# Patient Record
Sex: Female | Born: 1965 | Race: White | Hispanic: No | Marital: Married | Smoking: Never smoker
Health system: Southern US, Community
[De-identification: ages and names within clinical notes are randomized; demographics above are authoritative.]

## PROBLEM LIST (undated history)

## (undated) DIAGNOSIS — F329 Major depressive disorder, single episode, unspecified: Secondary | ICD-10-CM

## (undated) DIAGNOSIS — F32A Depression, unspecified: Secondary | ICD-10-CM

---

## 2008-11-08 ENCOUNTER — Ambulatory Visit: Payer: Self-pay

## 2011-08-13 ENCOUNTER — Emergency Department: Payer: Self-pay | Admitting: Emergency Medicine

## 2011-08-14 LAB — COMPREHENSIVE METABOLIC PANEL
Alkaline Phosphatase: 58 U/L (ref 50–136)
Anion Gap: 16 (ref 7–16)
BUN: 15 mg/dL (ref 7–18)
Bilirubin,Total: 0.2 mg/dL (ref 0.2–1.0)
Calcium, Total: 8.6 mg/dL (ref 8.5–10.1)
Chloride: 99 mmol/L (ref 98–107)
Co2: 28 mmol/L (ref 21–32)
Creatinine: 0.88 mg/dL (ref 0.60–1.30)
EGFR (African American): >60
EGFR (Non-African Amer.): >60
Glucose: 111 mg/dL — ABNORMAL HIGH (ref 65–99)
Potassium: 3.3 mmol/L — ABNORMAL LOW (ref 3.5–5.1)
SGOT(AST): 26 U/L (ref 15–37)
SGPT (ALT): 24 U/L

## 2011-08-14 LAB — CBC
HCT: 31.5 % — ABNORMAL LOW (ref 35.0–47.0)
HGB: 10.1 g/dL — ABNORMAL LOW (ref 12.0–16.0)
MCH: 24.3 pg — ABNORMAL LOW (ref 26.0–34.0)
MCHC: 31.9 g/dL — ABNORMAL LOW (ref 32.0–36.0)
Platelet: 350 10*3/uL (ref 150–440)
RBC: 4.15 10*6/uL (ref 3.80–5.20)
WBC: 8.4 10*3/uL (ref 3.6–11.0)

## 2011-08-14 LAB — TROPONIN I: Troponin-I: <0.02 ng/mL

## 2012-02-26 ENCOUNTER — Ambulatory Visit: Payer: Self-pay | Admitting: Obstetrics and Gynecology

## 2012-03-01 LAB — PATHOLOGY REPORT

## 2013-02-10 ENCOUNTER — Other Ambulatory Visit: Payer: Self-pay | Admitting: Urgent Care

## 2013-02-10 LAB — HEPATIC FUNCTION PANEL A (ARMC)
Alkaline Phosphatase: 69 U/L (ref 50–136)
Bilirubin, Direct: 0.1 mg/dL (ref 0.00–0.20)
Bilirubin,Total: 0.4 mg/dL (ref 0.2–1.0)
SGOT(AST): 21 U/L (ref 15–37)

## 2013-02-11 LAB — FERRITIN: Ferritin (ARMC): 3 ng/mL — ABNORMAL LOW (ref 8–388)

## 2013-02-17 ENCOUNTER — Ambulatory Visit: Payer: Self-pay | Admitting: Gastroenterology

## 2013-03-02 ENCOUNTER — Ambulatory Visit: Payer: Self-pay | Admitting: Urgent Care

## 2013-03-10 ENCOUNTER — Emergency Department: Payer: Self-pay | Admitting: Emergency Medicine

## 2013-03-10 LAB — CBC
HCT: 29.5 % — ABNORMAL LOW (ref 35.0–47.0)
MCV: 73 fL — ABNORMAL LOW (ref 80–100)
Platelet: 343 10*3/uL (ref 150–440)
RBC: 4.06 10*6/uL (ref 3.80–5.20)
RDW: 15.9 % — ABNORMAL HIGH (ref 11.5–14.5)
WBC: 9.1 10*3/uL (ref 3.6–11.0)

## 2013-03-10 LAB — TSH: Thyroid Stimulating Horm: 1.59 u[IU]/mL

## 2013-03-10 LAB — BASIC METABOLIC PANEL
Anion Gap: 6 — ABNORMAL LOW (ref 7–16)
Calcium, Total: 8.9 mg/dL (ref 8.5–10.1)
Chloride: 104 mmol/L (ref 98–107)
Co2: 27 mmol/L (ref 21–32)
EGFR (Non-African Amer.): >60
Glucose: 99 mg/dL (ref 65–99)
Osmolality: 274 (ref 275–301)
Potassium: 3.6 mmol/L (ref 3.5–5.1)
Sodium: 137 mmol/L (ref 136–145)

## 2013-03-10 LAB — TROPONIN I: Troponin-I: <0.02 ng/mL

## 2014-11-14 NOTE — Op Note (Signed)
PATIENT NAME:  Adrienne Todd, Adrienne Todd MR#:  [id number] DATE OF BIRTH:  May 20, 1966  DATE OF PROCEDURE:  02/26/2012  PREOPERATIVE DIAGNOSES:  1. Menorrhagia.  2. Endometrial polyp.   POSTOPERATIVE DIAGNOSES:  1. Menorrhagia.  2. Endometrial polyp.   PROCEDURES PERFORMED: Hysteroscopy and dilatation and curettage using MyoSure.   PRIMARY SURGEON: Florina Oundreas M. Bonney AidStaebler, MD  ANESTHESIA: General.   ESTIMATED BLOOD LOSS: Minimal.   OPERATIVE FLUIDS: 800 mL of crystalloid.   URINE OUTPUT: 30 mL straight catheter prior to beginning of the case.  FLUID DEFICIT: 50 mL with normal saline.   COMPLICATIONS: None.   INTRAOPERATIVE FINDINGS: Normal cervix and cervical canal. Bilateral tubal ostia were visualized. A small 1 cm broad-based polyp was noted on the anterior wall of the uterus and was removed using the MyoSure system. Further curettage of the endometrial cavity was taken with the MyoSure yielding minimal to moderate amount of tissue.   DRAINS OR TUBES: None.   IMPLANTS: None.   PREOPERATIVE ANTIBIOTICS: None.  SPECIMEN: Endometrial curetting.   POSTOPERATIVE CONDITION: Stable.   PROCEDURE IN DETAIL: Risks, benefits, and alternatives of the procedure were discussed with the patient prior to proceeding to the Operating Room. The patient was taken back to the Operating Room and placed under general endotracheal anesthesia, positioned in the dorsal lithotomy position. She was prepped and draped in the usual sterile fashion. Prior to beginning of the case, time out was performed after which the patient's bladder was straight-catheterized. Following the straight cath, a sterile speculum was placed, the cervix was visualized, and the anterior lip grasped with a single tooth tenaculum. The cervix was sequentially dilated using Pratt dilators to allow placement of the MyoSure scope. Following dilation hysteroscopy was begun noting the above findings. The polyp was removed in its entirety using the  MyoSure system. Following this further curettage was performed with the MyoSure to further sample the endometrial lining. Following this the cavity appeared grossly normal. The polyp had been removed without any stalk left behind. On retraction of the hysteroscope, the cervical canal was once again inspected and noted to be hemostatic. The tenaculum was removed and tenaculum sites were noted to be hemostatic. Afterwards the sterile speculum was noted. Sponge, needle, and instrument counts were correct x2. The patient tolerated the procedure well and was taken to Recovery Room in stable condition.  ____________________________ Florina OuAndreas M. Bonney AidStaebler, MD ams:slb D: 02/26/2012 22:33:50 ET T: 02/27/2012 11:51:42 ET JOB#: [id number]  cc: Florina OuAndreas M. Bonney AidStaebler, MD, <Dictator> Carmel SacramentoANDREAS Cathrine MusterM Marshea Wisher MD ELECTRONICALLY SIGNED 03/13/2012 2:12

## 2014-11-20 ENCOUNTER — Emergency Department
Admit: 2014-11-20 | Disposition: A | Discharge status: Home / Self Care | Payer: Self-pay | Admitting: Emergency Medicine

## 2015-03-20 ENCOUNTER — Encounter: Payer: Self-pay | Admitting: Emergency Medicine

## 2015-03-20 ENCOUNTER — Emergency Department: Payer: Commercial Managed Care - HMO

## 2015-03-20 ENCOUNTER — Inpatient Hospital Stay
Admit: 2015-03-20 | Discharge: 2015-03-20 | Disposition: A | Discharge status: Home / Self Care | Payer: Commercial Managed Care - HMO | Attending: Internal Medicine | Admitting: Internal Medicine

## 2015-03-20 ENCOUNTER — Observation Stay
Admission: EM | Admit: 2015-03-20 | Discharge: 2015-03-21 | Disposition: A | Discharge status: Home / Self Care | Payer: Commercial Managed Care - HMO | Attending: Internal Medicine | Admitting: Internal Medicine

## 2015-03-20 ENCOUNTER — Inpatient Hospital Stay: Payer: Commercial Managed Care - HMO

## 2015-03-20 DIAGNOSIS — K219 Gastro-esophageal reflux disease without esophagitis: Secondary | ICD-10-CM | POA: Insufficient documentation

## 2015-03-20 DIAGNOSIS — Z823 Family history of stroke: Secondary | ICD-10-CM | POA: Diagnosis not present

## 2015-03-20 DIAGNOSIS — Z8249 Family history of ischemic heart disease and other diseases of the circulatory system: Secondary | ICD-10-CM | POA: Diagnosis not present

## 2015-03-20 DIAGNOSIS — I6523 Occlusion and stenosis of bilateral carotid arteries: Secondary | ICD-10-CM | POA: Insufficient documentation

## 2015-03-20 DIAGNOSIS — R200 Anesthesia of skin: Secondary | ICD-10-CM | POA: Insufficient documentation

## 2015-03-20 DIAGNOSIS — H538 Other visual disturbances: Secondary | ICD-10-CM | POA: Diagnosis not present

## 2015-03-20 DIAGNOSIS — Z880 Allergy status to penicillin: Secondary | ICD-10-CM | POA: Diagnosis not present

## 2015-03-20 DIAGNOSIS — I6502 Occlusion and stenosis of left vertebral artery: Secondary | ICD-10-CM | POA: Diagnosis not present

## 2015-03-20 DIAGNOSIS — J341 Cyst and mucocele of nose and nasal sinus: Secondary | ICD-10-CM | POA: Diagnosis not present

## 2015-03-20 DIAGNOSIS — R51 Headache: Secondary | ICD-10-CM | POA: Diagnosis not present

## 2015-03-20 DIAGNOSIS — I639 Cerebral infarction, unspecified: Secondary | ICD-10-CM | POA: Diagnosis present

## 2015-03-20 DIAGNOSIS — F329 Major depressive disorder, single episode, unspecified: Secondary | ICD-10-CM | POA: Insufficient documentation

## 2015-03-20 DIAGNOSIS — R531 Weakness: Secondary | ICD-10-CM | POA: Insufficient documentation

## 2015-03-20 DIAGNOSIS — Z79899 Other long term (current) drug therapy: Secondary | ICD-10-CM | POA: Insufficient documentation

## 2015-03-20 DIAGNOSIS — M542 Cervicalgia: Secondary | ICD-10-CM

## 2015-03-20 HISTORY — DX: Depression, unspecified: F32.A

## 2015-03-20 HISTORY — DX: Major depressive disorder, single episode, unspecified: F32.9

## 2015-03-20 LAB — COMPREHENSIVE METABOLIC PANEL
ALK PHOS: 66 U/L (ref 38–126)
ALT: 20 U/L (ref 14–54)
AST: 24 U/L (ref 15–41)
Albumin: 4.9 g/dL (ref 3.5–5.0)
Anion gap: 11 (ref 5–15)
BUN: 12 mg/dL (ref 6–20)
CALCIUM: 9.5 mg/dL (ref 8.9–10.3)
CO2: 27 mmol/L (ref 22–32)
CREATININE: 1 mg/dL (ref 0.44–1.00)
Chloride: 96 mmol/L — ABNORMAL LOW (ref 101–111)
Glucose, Bld: 83 mg/dL (ref 65–99)
Potassium: 3.7 mmol/L (ref 3.5–5.1)
Sodium: 134 mmol/L — ABNORMAL LOW (ref 135–145)
Total Bilirubin: 0.8 mg/dL (ref 0.3–1.2)
Total Protein: 9 g/dL — ABNORMAL HIGH (ref 6.5–8.1)

## 2015-03-20 LAB — CBC WITH DIFFERENTIAL/PLATELET
Basophils Absolute: 0 10*3/uL (ref 0–0.1)
Basophils Relative: 1 %
EOS PCT: 1 %
Eosinophils Absolute: 0.1 10*3/uL (ref 0–0.7)
HCT: 42 % (ref 35.0–47.0)
HEMOGLOBIN: 13.4 g/dL (ref 12.0–16.0)
LYMPHS ABS: 1.8 10*3/uL (ref 1.0–3.6)
Lymphocytes Relative: 23 %
MCH: 25.6 pg — AB (ref 26.0–34.0)
MCHC: 31.9 g/dL — ABNORMAL LOW (ref 32.0–36.0)
MCV: 80.3 fL (ref 80.0–100.0)
Monocytes Absolute: 0.3 10*3/uL (ref 0.2–0.9)
Monocytes Relative: 4 %
Neutro Abs: 5.8 10*3/uL (ref 1.4–6.5)
Neutrophils Relative %: 71 %
Platelets: 320 10*3/uL (ref 150–440)
RBC: 5.24 MIL/uL — AB (ref 3.80–5.20)
RDW: 16.3 % — ABNORMAL HIGH (ref 11.5–14.5)
WBC: 8.1 10*3/uL (ref 3.6–11.0)

## 2015-03-20 LAB — TROPONIN I
Troponin I: <0.03 ng/mL (ref <0.031)
Troponin I: <0.03 ng/mL (ref <0.031)

## 2015-03-20 LAB — LIPID PANEL
CHOLESTEROL: 264 mg/dL — AB (ref 0–200)
HDL: 58 mg/dL (ref >40)
LDL Cholesterol: 193 mg/dL — ABNORMAL HIGH (ref 0–99)
TRIGLYCERIDES: 65 mg/dL (ref <150)
Total CHOL/HDL Ratio: 4.6 RATIO
VLDL: 13 mg/dL (ref 0–40)

## 2015-03-20 LAB — PROTIME-INR
INR: 0.95
PROTHROMBIN TIME: 12.9 s (ref 11.4–15.0)

## 2015-03-20 MED ORDER — DOCUSATE SODIUM 100 MG PO CAPS: 100.0000 mg | ORAL_CAPSULE | Freq: Two times a day (BID) | ORAL | Status: DC | PRN

## 2015-03-20 MED ORDER — ASPIRIN 81 MG PO CHEW
81.0000 mg | CHEWABLE_TABLET | Freq: Once | ORAL | Status: AC
Start: 2015-03-20 — End: 2015-03-20
  Administered 2015-03-20: 81 mg via ORAL
  Filled 2015-03-20: qty 1

## 2015-03-20 MED ORDER — LORAZEPAM 2 MG/ML IJ SOLN
2.0000 mg | Freq: Once | INTRAMUSCULAR | Status: AC
  Administered 2015-03-21: 09:00:00 2 mg via INTRAVENOUS
  Filled 2015-03-20: qty 1

## 2015-03-20 MED ORDER — ENOXAPARIN SODIUM 40 MG/0.4ML ~~LOC~~ SOLN
40.0000 mg | SUBCUTANEOUS | Status: DC
  Administered 2015-03-20: 40 mg via SUBCUTANEOUS
  Filled 2015-03-20: qty 0.4

## 2015-03-20 MED ORDER — SENNA 8.6 MG PO TABS: 1.0000 | ORAL_TABLET | Freq: Every day | ORAL | Status: DC | PRN

## 2015-03-20 MED ORDER — ACETAMINOPHEN 325 MG PO TABS
650.0000 mg | ORAL_TABLET | Freq: Four times a day (QID) | ORAL | Status: DC | PRN
  Administered 2015-03-20: 20:00:00 650 mg via ORAL
  Filled 2015-03-20: qty 2

## 2015-03-20 MED ORDER — PANTOPRAZOLE SODIUM 40 MG PO TBEC
40.0000 mg | DELAYED_RELEASE_TABLET | Freq: Every day | ORAL | Status: DC
  Administered 2015-03-20 – 2015-03-21 (×2): 40 mg via ORAL
  Filled 2015-03-20 (×2): qty 1

## 2015-03-20 MED ORDER — LORAZEPAM 2 MG/ML IJ SOLN
INTRAMUSCULAR | Status: AC
  Filled 2015-03-20: qty 1

## 2015-03-20 MED ORDER — SODIUM CHLORIDE 0.9 % IJ SOLN
3.0000 mL | Freq: Two times a day (BID) | INTRAMUSCULAR | Status: DC
  Administered 2015-03-20 – 2015-03-21 (×2): 3 mL via INTRAVENOUS

## 2015-03-20 MED ORDER — SERTRALINE HCL 50 MG PO TABS
50.0000 mg | ORAL_TABLET | Freq: Every day | ORAL | Status: DC
  Administered 2015-03-21: 11:00:00 50 mg via ORAL
  Filled 2015-03-20: qty 1

## 2015-03-20 MED ORDER — ACETAMINOPHEN 650 MG RE SUPP: 650.0000 mg | Freq: Four times a day (QID) | RECTAL | Status: DC | PRN

## 2015-03-20 MED ORDER — ALBUTEROL SULFATE (2.5 MG/3ML) 0.083% IN NEBU: 2.5000 mg | INHALATION_SOLUTION | RESPIRATORY_TRACT | Status: DC | PRN

## 2015-03-20 MED ORDER — ASPIRIN EC 81 MG PO TBEC
81.0000 mg | DELAYED_RELEASE_TABLET | Freq: Every day | ORAL | Status: DC
  Administered 2015-03-21: 11:00:00 81 mg via ORAL
  Filled 2015-03-20: qty 1

## 2015-03-20 NOTE — Plan of Care (Signed)
Problem: Discharge/Transitional Outcomes Goal: Barriers To Progression Addressed/Resolved Outcome: Progressing Pt lives at home with husband and works full time. She complains of numbness and heaviness to left arm and leg. NIH was a 0 x2.Left sided weakness reported and grip is weaker then right. Pt saw a chiropractic 1 week ago for a constant headache. Woke up 03/19/15 with dizziness and came to Er for further evaluation. Asprin given in the ER and numerology consulted

## 2015-03-20 NOTE — Discharge Summary (Deleted)
[facility] - Short at [facility]   PATIENT NAME: Adrienne Todd    MR#:  [id number]  DATE OF BIRTH:  [birth year]  DATE OF ADMISSION:  03/20/2015 ADMITTING PHYSICIAN: Enid Baas, MD  DATE OF DISCHARGE: Patient left AMA on 03/20/2015  PRIMARY CARE PHYSICIAN: Marta Antu, CNM    ADMISSION DIAGNOSIS:  Numbness; Tingling  DISCHARGE DIAGNOSIS:  Active Problems:   CVA (cerebral infarction) or TIA  SECONDARY DIAGNOSIS:   Past Medical History  Diagnosis Date  . Depression     HOSPITAL COURSE:   Adrienne Todd is a 49 y.o. female with no significant past medical history presents to the hospital secondary to a sided tingling and numbness that started early this morning.   #1 TIA versus CVA-left-sided tingling and numbness and heaviness of the arm. Also some leg symptoms noted. Symptoms are much improved than this morning. So could be a TIA. -Admitted, do neuro checks. -MRI and MRA of the brain, carotid Dopplers and echocardiogram -PT and OT consults. -HbA1c and lipid profile for risk stratification -Started on aspirin at this time. Patient couldn't tolerate MRA/MRI- she was asked prior to sending to MRI about the option of sedation and she refused at that time. Halfway through MRA- she couldn't tolerate it. She felt emotional. Refused ativan. Didn't think she was claustrophobic.  Wanted to leave. Discussed about staying and having MRI done tomorrow with sedation- she didn't want to stay. Explained the possible risks of leaving, if this were to be a major CVA. Her symptoms have much improved and she left signing AMA papers. Advised her to take aspirin over the counter.  #2 depression and anxiety-continue Zoloft  #3 gastroesophageal reflux disease-continue Protonix  DISCHARGE CONDITIONS:   Guarded  CONSULTS OBTAINED:   None  DRUG ALLERGIES:   Allergies  Allergen Reactions  . Penicillins Rash    DISCHARGE MEDICATIONS:   Current  Discharge Medication List    CONTINUE these medications which have NOT CHANGED   Details  pantoprazole (PROTONIX) 40 MG tablet Take 40 mg by mouth daily.    sertraline (ZOLOFT) 50 MG tablet Take 50 mg by mouth daily.         DISCHARGE INSTRUCTIONS:   1. PCP f/u ASAP 2. Advised to take aspirin  If you experience worsening of your admission symptoms, develop shortness of breath, life threatening emergency, suicidal or homicidal thoughts you must seek medical attention immediately by calling 911 or calling your MD immediately  if symptoms less severe.  You Must read complete instructions/literature along with all the possible adverse reactions/side effects for all the Medicines you take and that have been prescribed to you. Take any new Medicines after you have completely understood and accept all the possible adverse reactions/side effects.   Please note  You were cared for by a hospitalist during your hospital stay. If you have any questions about your discharge medications or the care you received while you were in the hospital after you are discharged, you can call the unit and asked to speak with the hospitalist on call if the hospitalist that took care of you is not available. Once you are discharged, your primary care physician will handle any further medical issues. Please note that NO REFILLS for any discharge medications will be authorized once you are discharged, as it is imperative that you return to your primary care physician (or establish a relationship with a primary care physician if you do not have one) for your aftercare needs so that  they can reassess your need for medications and monitor your lab values.    Today   CHIEF COMPLAINT:   Chief Complaint  Patient presents with  . Weakness  . Headache    VITAL SIGNS:  Blood pressure 146/79, pulse 68, temperature 98.1 F (36.7 C), temperature source Oral, resp. rate 13, height 5\' 6"  (1.676 m), weight 78.019 kg (172  lb), SpO2 99 %.  I/O:  No intake or output data in the 24 hours ending 03/20/15 1728  PHYSICAL EXAMINATION:   Physical Exam  GENERAL: 49 y.o.-year-old patient lying in the bed with no acute distress.  EYES: Pupils equal, round, reactive to light and accommodation. No scleral icterus. Extraocular muscles intact.  HEENT: Head atraumatic, normocephalic. Oropharynx and nasopharynx clear.  NECK: Supple, no jugular venous distention. No thyroid enlargement, no tenderness.  LUNGS: Normal breath sounds bilaterally, no wheezing, rales,rhonchi or crepitation. No use of accessory muscles of respiration.  CARDIOVASCULAR: S1, S2 normal. No murmurs, rubs, or gallops.  ABDOMEN: Soft, nontender, nondistended. Bowel sounds present. No organomegaly or mass.  EXTREMITIES: No pedal edema, cyanosis, or clubbing.  NEUROLOGIC: Cranial nerves II through XII are intact. Muscle strength 5/5 in all extremities. Sensation dull on left arm and feels heavy on that side. Otherwise intact on all the other sites.. Gait not checked. No nystagmus noted. Reflexes are 2+ all 4 extremities. PSYCHIATRIC: The patient is alert and oriented x 3.  SKIN: No obvious rash, lesion, or ulcer.   DATA REVIEW:   CBC  Recent Labs Lab 03/20/15 1221  WBC 8.1  HGB 13.4  HCT 42.0  PLT 320    Chemistries   Recent Labs Lab 03/20/15 1221  NA 134*  K 3.7  CL 96*  CO2 27  GLUCOSE 83  BUN 12  CREATININE 1.00  CALCIUM 9.5  AST 24  ALT 20  ALKPHOS 66  BILITOT 0.8    Cardiac Enzymes  Recent Labs Lab 03/20/15 1221  TROPONINI <0.03    Microbiology Results  No results found for this or any previous visit.  RADIOLOGY:  Ct Head Wo Contrast  03/20/2015   CLINICAL DATA:  Left-sided numbness and blurred vision beginning at 2:30 a.m. today. Initial encounter.  EXAM: CT HEAD WITHOUT CONTRAST  TECHNIQUE: Contiguous axial images were obtained from the base of the skull through the vertex without intravenous  contrast.  COMPARISON:  Head CT scan 08/13/2011.  FINDINGS: The brain appears normal without hemorrhage, infarct, mass lesion, mass effect, midline shift or abnormal extra-axial fluid collection. No hydrocephalus or pneumocephalus. The calvarium is intact. Imaged paranasal sinuses and mastoid air cells are clear.  IMPRESSION: Normal head CT.   Electronically Signed   By: Drusilla Kanner M.D.   On: 03/20/2015 11:46    EKG:   Orders placed or performed during the hospital encounter of 03/20/15  . ED EKG  . ED EKG      Management plans discussed with the patient, family and they are in agreement.  CODE STATUS:     Code Status Orders        Start     Ordered   03/20/15 1628  Full code   Continuous     03/20/15 1627      TOTAL TIME TAKING CARE OF THIS PATIENT: 35 minutes.    Enid Baas M.D on 03/20/2015 at 5:28 PM  Between 7am to 6pm - Pager - [phone]  After 6pm go to [email or web address] - password [email or web address]  Office  [phone]  CC: Primary care physician; Marta Antu, CNM

## 2015-03-20 NOTE — H&P (Signed)
[facility] -  at [facility]   PATIENT NAME: Adrienne Todd    MR#:  [id number]  DATE OF BIRTH:  11/07/1965  DATE OF ADMISSION:  03/20/2015  PRIMARY CARE PHYSICIAN: Marta Antu, CNM   REQUESTING/REFERRING PHYSICIAN: Dr. Huel Cote  CHIEF COMPLAINT:   Chief Complaint  Patient presents with  . Weakness  . Headache    HISTORY OF PRESENT ILLNESS:  Adrienne Todd  is a 49 y.o. female with no significant past medical history presents to the hospital secondary to a sided tingling and numbness that started early this morning. Patient said she never had this symptom . She woke up around 2:30 AM this morning and felt that her left arm was numb and tingling and felt heavy. She is right-handed at baseline. She was very disease was seeing double vision was not clear. She thought she was tired she went back to sleep and woke up again an hour later continued to have the same symptoms. She had some nausea at the same time denied any chest pain. She she went to her clinic and complained of these symptoms and was advised to come to the emergency room. CT of her head is negative for any changes at this time so she is being admitted for possible CVA versus TIA. Her symptoms are still persistent though much improved and this morning.  PAST MEDICAL HISTORY:   Past Medical History  Diagnosis Date  . Depression     PAST SURGICAL HISTORY:   Past Surgical History  Procedure Laterality Date  . Cesarean section      SOCIAL HISTORY:   Social History  Substance Use Topics  . Smoking status: Never Smoker   . Smokeless tobacco: Not on file  . Alcohol Use: No    FAMILY HISTORY:   Family History  Problem Relation Age of Onset  . CAD Mother   . CAD Father   . CVA Father     DRUG ALLERGIES:   Allergies  Allergen Reactions  . Penicillins Rash    REVIEW OF SYSTEMS:   Review of Systems  Constitutional: Negative for fever, chills, weight loss and  malaise/fatigue.  HENT: Negative for ear discharge, ear pain, hearing loss, nosebleeds and tinnitus.   Eyes: Negative for blurred vision, double vision and photophobia.  Respiratory: Negative for cough, hemoptysis, shortness of breath and wheezing.   Cardiovascular: Negative for chest pain, palpitations, orthopnea and leg swelling.  Gastrointestinal: Positive for heartburn and nausea. Negative for vomiting, abdominal pain, diarrhea, constipation and melena.  Genitourinary: Negative for dysuria, urgency, frequency and hematuria.  Musculoskeletal: Negative for myalgias, back pain and neck pain.  Skin: Negative for rash.  Neurological: Positive for dizziness, sensory change and headaches. Negative for tingling, tremors, speech change and focal weakness.  Endo/Heme/Allergies: Does not bruise/bleed easily.  Psychiatric/Behavioral: Negative for depression.    MEDICATIONS AT HOME:   Prior to Admission medications   Medication Sig Start Date End Date Taking? Authorizing Provider  pantoprazole (PROTONIX) 40 MG tablet Take 40 mg by mouth daily.   Yes Historical Provider, MD  sertraline (ZOLOFT) 50 MG tablet Take 50 mg by mouth daily.   Yes Historical Provider, MD      VITAL SIGNS:  Blood pressure 128/75, pulse 59, temperature 98.2 F (36.8 C), temperature source Oral, resp. rate 15, height 5\' 6"  (1.676 m), weight 78.019 kg (172 lb), SpO2 99 %.  PHYSICAL EXAMINATION:   Physical Exam  GENERAL:  49 y.o.-year-old patient lying in the bed with  no acute distress.  EYES: Pupils equal, round, reactive to light and accommodation. No scleral icterus. Extraocular muscles intact.  HEENT: Head atraumatic, normocephalic. Oropharynx and nasopharynx clear.  NECK:  Supple, no jugular venous distention. No thyroid enlargement, no tenderness.  LUNGS: Normal breath sounds bilaterally, no wheezing, rales,rhonchi or crepitation. No use of accessory muscles of respiration.  CARDIOVASCULAR: S1, S2 normal. No  murmurs, rubs, or gallops.  ABDOMEN: Soft, nontender, nondistended. Bowel sounds present. No organomegaly or mass.  EXTREMITIES: No pedal edema, cyanosis, or clubbing.  NEUROLOGIC: Cranial nerves II through XII are intact. Muscle strength 5/5 in all extremities. Sensation dull on left arm and feels heavy on that side. Otherwise intact on all the other sites.. Gait not checked. No nystagmus noted. Reflexes are 2+ all 4 extremities. PSYCHIATRIC: The patient is alert and oriented x 3.  SKIN: No obvious rash, lesion, or ulcer.   LABORATORY PANEL:   CBC  Recent Labs Lab 03/20/15 1221  WBC 8.1  HGB 13.4  HCT 42.0  PLT 320   ------------------------------------------------------------------------------------------------------------------  Chemistries   Recent Labs Lab 03/20/15 1221  NA 134*  K 3.7  CL 96*  CO2 27  GLUCOSE 83  BUN 12  CREATININE 1.00  CALCIUM 9.5  AST 24  ALT 20  ALKPHOS 66  BILITOT 0.8   ------------------------------------------------------------------------------------------------------------------  Cardiac Enzymes  Recent Labs Lab 03/20/15 1221  TROPONINI <0.03   ------------------------------------------------------------------------------------------------------------------  RADIOLOGY:  Ct Head Wo Contrast  03/20/2015   CLINICAL DATA:  Left-sided numbness and blurred vision beginning at 2:30 a.m. today. Initial encounter.  EXAM: CT HEAD WITHOUT CONTRAST  TECHNIQUE: Contiguous axial images were obtained from the base of the skull through the vertex without intravenous contrast.  COMPARISON:  Head CT scan 08/13/2011.  FINDINGS: The brain appears normal without hemorrhage, infarct, mass lesion, mass effect, midline shift or abnormal extra-axial fluid collection. No hydrocephalus or pneumocephalus. The calvarium is intact. Imaged paranasal sinuses and mastoid air cells are clear.  IMPRESSION: Normal head CT.   Electronically Signed   By: Drusilla Kanner  M.D.   On: 03/20/2015 11:46    EKG:   Orders placed or performed during the hospital encounter of 03/20/15  . ED EKG  . ED EKG    IMPRESSION AND PLAN:   Adrienne Todd  is a 49 y.o. female with no significant past medical history presents to the hospital secondary to a sided tingling and numbness that started early this morning.   #1 TIA versus CVA-left-sided tingling and numbness and heaviness of the arm. Also some leg symptoms noted. Symptoms are much improved than this morning. So could be a TIA. -Admit, do neuro checks. -MRI and MRA of the brain, carotid Dopplers and echocardiogram -PT and OT consults. -HbA1c and lipid profile for risk stratification -Started on aspirin at this time.  #2 depression and anxiety-continue Zoloft  #3 gastroesophageal reflux disease-continue Protonix  #4 DVT prophylaxis-on Lovenox.   All the records are reviewed and case discussed with ED provider. Management plans discussed with the patient, family and they are in agreement.  CODE STATUS: Full code  TOTAL TIME TAKING CARE OF THIS PATIENT: 50 minutes.    Enid Baas M.D on 03/20/2015 at 3:22 PM  Between 7am to 6pm - Pager - [phone]  After 6pm go to [email or web address] - password [email or web address] [facility]  [place]  Office  [phone]  CC: Primary care physician; Marta Antu, CNM

## 2015-03-20 NOTE — ED Provider Notes (Signed)
Time Seen: Approximately 1140  I have reviewed the triage notes  Chief Complaint: Weakness and Headache   History of Present Illness: Adrienne Todd is a 49 y.o. female who presents with symptoms of feeling "" dizzy "". Patient states at 2:30 this morning she noticed that she had some double vision and inability to focus along with some left upper extremity and left lower extremity weakness. She denies any visual field deficits or trouble with speech or swallowing. She states her left upper extremity especially still feels weak and "" clumsy"" to her. She states she has a hard time grasping objects. She denies any ataxia. She is alsosome slight weakness in her left lower extremity no though not nearly as severe as the left upper extremity. She denies any chest pain or shortness of breath. She's had some mild nausea decreased appetite without any persistent vomiting.   Past Medical History  Diagnosis Date  . Depression     Patient Active Problem List   Diagnosis Date Noted  . CVA (cerebral infarction) 03/20/2015    Past Surgical History  Procedure Laterality Date  . Cesarean section      Past Surgical History  Procedure Laterality Date  . Cesarean section      Current Outpatient Rx  Name  Route  Sig  Dispense  Refill  . pantoprazole (PROTONIX) 40 MG tablet   Oral   Take 40 mg by mouth daily.         . sertraline (ZOLOFT) 50 MG tablet   Oral   Take 50 mg by mouth daily.           Allergies:  Penicillins  Family History: Family History  Problem Relation Age of Onset  . CAD Mother   . CAD Father   . CVA Father     Social History: Social History  Substance Use Topics  . Smoking status: Never Smoker   . Smokeless tobacco: None  . Alcohol Use: No     Review of Systems:   10 point review of systems was performed and was otherwise negative:  Constitutional: No fever Eyes: Double vision ENT: No sore throat, ear pain Cardiac: No chest  pain Respiratory: No shortness of breath, wheezing, or stridor Abdomen: No abdominal pain, no vomiting, No diarrhea Endocrine: No weight loss, No night sweats Extremities: No peripheral edema, cyanosis Skin: No rashes, easy bruising Neurologic: Normal outside of that mentioned in the H&P Urologic: No dysuria, Hematuria, or urinary frequency   Physical Exam:  ED Triage Vitals  Enc Vitals Group     BP 03/20/15 1231 136/77 mmHg     Pulse Rate 03/20/15 1231 56     Resp 03/20/15 1231 14     Temp 03/20/15 1231 98.2 F (36.8 C)     Temp Source 03/20/15 1231 Oral     SpO2 03/20/15 1231 98 %     Weight 03/20/15 1103 172 lb (78.019 kg)     Height 03/20/15 1103  (1.676 m)     Head Cir --      Peak Flow --      Pain Score 03/20/15 1103 1     Pain Loc --      Pain Edu? --      Excl. in GC? --     General: Awake , Alert , and Oriented times 3; GCS 15 Head: Normal cephalic , atraumatic Eyes: Pupils equal , round, reactive to light. No eye pain or visual field deficits Nose/Throat:  No nasal drainage, patent upper airway without erythema or exudate.  Neck: Supple, Full range of motion, No anterior adenopathy or palpable thyroid masses. No bruits Lungs: Clear to ascultation without wheezes , rhonchi, or rales Heart: Bradycardic, regular rhythm without murmurs , gallops , or rubs Abdomen: Soft, non tender without rebound, guarding , or rigidity; bowel sounds positive and symmetric in all 4 quadrants. No organomegaly .        Extremities: 2 plus symmetric pulses. No edema, clubbing or cyanosis Neurologic: Patient has mild left upper extremity weakness in comparison to the right. She is right-handed though states subjectively he feels clumsy with hand grasp. Lower extremities are symmetric in strength. Sensory is intact. Normal cerebellar testing  Skin: warm, dry, no rashes   Labs:   All laboratory work was reviewed including any pertinent negatives or positives listed below:  Labs  Reviewed  CBC WITH DIFFERENTIAL/PLATELET - Abnormal; Notable for the following:    RBC 5.24 (*)    MCH 25.6 (*)    MCHC 31.9 (*)    RDW 16.3 (*)    All other components within normal limits  COMPREHENSIVE METABOLIC PANEL - Abnormal; Notable for the following:    Sodium 134 (*)    Chloride 96 (*)    Total Protein 9.0 (*)    All other components within normal limits  TROPONIN I  PROTIME-INR   laboratory work was reviewed with no significant abnormalities  EKG:  ED ECG REPORT I, Jennye Moccasin, the attending physician, personally viewed and interpreted this ECG.  Date: 03/20/2015 EKG Time: 1302 Rate: 46 Rhythm: Sinus bradycardia QRS Axis: normal Intervals: normal ST/T Wave abnormalities: normal Conduction Disutrbances: none Narrative Interpretation: unremarkable Sinus bradycardia, otherwise normal EKG   Radiology: I personally reviewed the radiologic studies   Procedures: noneEXAM: CT HEAD WITHOUT CONTRAST  TECHNIQUE: Contiguous axial images were obtained from the base of the skull through the vertex without intravenous contrast.  COMPARISON: Head CT scan 08/13/2011.  FINDINGS: The brain appears normal without hemorrhage, infarct, mass lesion, mass effect, midline shift or abnormal extra-axial fluid collection. No hydrocephalus or pneumocephalus. The calvarium is intact. Imaged paranasal sinuses and mastoid air cells are clear.  IMPRESSION: Normal head CT.   Critical Care: None    ED Course: Patient's stay here was uneventful. Patient's outside of any aggressive anticoagulants or clot retrieval time. Her symptoms started at 2:30 this morning. Patient did not develop any further symptoms while here in emergency department received aspirin therapy after negative head CT    Assessment: Possible Cerebrovascular accident   Final Clinical Impression: Cerebrovascular accident Final diagnoses:  Cerebral infarction due to unspecified mechanism     Plan:  Hospitalist consultation            Jennye Moccasin, MD 03/20/15 [phone]

## 2015-03-20 NOTE — ED Notes (Signed)
Reports ha, left arm and leg tingling, nausea.  Onset 0230 this am

## 2015-03-20 NOTE — ED Notes (Addendum)
Pt complains of left-sided weakness, dizziness, blurred vision started around 2am. No left sided drift or facial droop noted. Denies headache

## 2015-03-20 NOTE — Progress Notes (Signed)
*  PRELIMINARY RESULTS* Echocardiogram 2D Echocardiogram has been performed.  Adrienne Todd Stills 03/20/2015, 8:50 PM

## 2015-03-20 NOTE — Progress Notes (Addendum)
Patient couldn't tolerate MRA/MRI- she was asked prior to sending to MRI about the option of sedation and she refused at that time. Halfway through MRA- she couldn't tolerate it. She felt emotional. Refused ativan. Didn't think she was claustrophobic.  Wanted to leave. Discussed about staying and having MRI done tomorrow with sedation- she didn't want to stay. Explained the possible risks of leaving.Marland Kitchen Her symptoms have much improved and she wanted to leave. Advised her to take aspirin over the counter.  But after re-discussion- agreed to stay. Wants to get MRI done tomorrow with sedation. Please order ativan for MRI tomorrow.-

## 2015-03-21 ENCOUNTER — Inpatient Hospital Stay: Payer: Commercial Managed Care - HMO

## 2015-03-21 LAB — BASIC METABOLIC PANEL
ANION GAP: 6 (ref 5–15)
BUN: 12 mg/dL (ref 6–20)
CHLORIDE: 104 mmol/L (ref 101–111)
CO2: 28 mmol/L (ref 22–32)
Calcium: 8.8 mg/dL — ABNORMAL LOW (ref 8.9–10.3)
Creatinine, Ser: 1 mg/dL (ref 0.44–1.00)
Glucose, Bld: 105 mg/dL — ABNORMAL HIGH (ref 65–99)
POTASSIUM: 4.5 mmol/L (ref 3.5–5.1)
SODIUM: 138 mmol/L (ref 135–145)

## 2015-03-21 LAB — VITAMIN B12: Vitamin B-12: 243 pg/mL (ref 180–914)

## 2015-03-21 LAB — CBC
HEMATOCRIT: 38.9 % (ref 35.0–47.0)
Hemoglobin: 12.4 g/dL (ref 12.0–16.0)
MCH: 25.2 pg — ABNORMAL LOW (ref 26.0–34.0)
MCHC: 31.7 g/dL — ABNORMAL LOW (ref 32.0–36.0)
MCV: 79.4 fL — AB (ref 80.0–100.0)
Platelets: 303 10*3/uL (ref 150–440)
RBC: 4.9 MIL/uL (ref 3.80–5.20)
RDW: 16.2 % — AB (ref 11.5–14.5)
WBC: 7.3 10*3/uL (ref 3.6–11.0)

## 2015-03-21 LAB — TROPONIN I

## 2015-03-21 LAB — FOLATE: Folate: 16.6 ng/mL (ref >5.9)

## 2015-03-21 LAB — TSH: TSH: 1.911 u[IU]/mL (ref 0.350–4.500)

## 2015-03-21 LAB — HEMOGLOBIN A1C: HEMOGLOBIN A1C: 5.6 % (ref 4.0–6.0)

## 2015-03-21 LAB — SEDIMENTATION RATE: SED RATE: 24 mm/h — AB (ref 0–20)

## 2015-03-21 MED ORDER — LORAZEPAM 2 MG/ML IJ SOLN: 1.0000 mg | Freq: Once | INTRAMUSCULAR | Status: DC | PRN

## 2015-03-21 MED ORDER — GADOBENATE DIMEGLUMINE 529 MG/ML IV SOLN
20.0000 mL | Freq: Once | INTRAVENOUS | Status: AC | PRN
  Administered 2015-03-21: 12:00:00 16 mL via INTRAVENOUS

## 2015-03-21 MED ORDER — NAPROXEN 500 MG PO TABS: 500.0000 mg | ORAL_TABLET | Freq: Two times a day (BID) | ORAL | Status: DC

## 2015-03-21 MED ORDER — ASPIRIN 81 MG PO TBEC: 81.0000 mg | DELAYED_RELEASE_TABLET | Freq: Every day | ORAL | Status: DC

## 2015-03-21 MED ORDER — LORAZEPAM 1 MG PO TABS
2.0000 mg | ORAL_TABLET | ORAL | Status: AC
  Administered 2015-03-21: 08:00:00 2 mg via ORAL
  Filled 2015-03-21: qty 2

## 2015-03-21 MED ORDER — ATORVASTATIN CALCIUM 20 MG PO TABS: 20.0000 mg | ORAL_TABLET | Freq: Every day | ORAL | Status: DC

## 2015-03-21 MED ORDER — ONDANSETRON HCL 4 MG/2ML IJ SOLN
4.0000 mg | Freq: Four times a day (QID) | INTRAMUSCULAR | Status: DC | PRN
  Administered 2015-03-21: 4 mg via INTRAVENOUS
  Filled 2015-03-21: qty 2

## 2015-03-21 NOTE — Progress Notes (Signed)
PT Attempt Note  Patient Details Name: Adrienne Todd MRN: [id number] DOB: [birth year]   Cancelled Treatment:    Reason Eval/Treat Not Completed: Patient at procedure or test/unavailable. Attempted to see patient but she is currently at MRI. Will attempt PT evaluation on later time/date as patient is available/approriate.  Sharalyn Ink Huprich PT, DPT   Huprich,Jason 03/21/2015, 9:41 AM

## 2015-03-21 NOTE — Progress Notes (Signed)
OT Cancellation Note  Patient Details Name: REGENA DELUCCHI MRN: [id number] DOB: [birth year]   Cancelled Treatment:    Reason Eval/Treat Not Completed: Patient at procedure or test/ unavailable. (MRI)  Gwyndolyn Kaufman, MS/OTR/L  03/21/2015, 9:53 AM

## 2015-03-21 NOTE — Progress Notes (Signed)
Discussed discharge medications and instructions with pt and her husband.  Made them both aware of follow-up appointment.  Removed IV per policy.  No questions at this time.  Pt transported via car by husband.  Orson Ape, RN

## 2015-03-21 NOTE — Consult Note (Signed)
Reason for Consult: stroke Referring Physician: Dr. [name] is an 49 y.o. female.  HPI: seen at request of Dr. Manuella Ghazi for headache;  49 yo RHD F presents to [facility] secondary to L sided numbness and tingling for the past two days.  Pt also reports a headache for the past few weeks for which she saw a chiropractor.  Headache is mainly in the back of neck without photophobia or N/V.  Nothing makes headache better or worse.  Past Medical History  Diagnosis Date  . Depression     Past Surgical History  Procedure Laterality Date  . Cesarean section      Family History  Problem Relation Age of Onset  . CAD Mother   . CAD Father   . CVA Father     Social History:  reports that she has never smoked. She does not have any smokeless tobacco history on file. She reports that she does not drink alcohol. Her drug history is not on file.  Allergies:  Allergies  Allergen Reactions  . Penicillins Rash    Medications: personally reviewed by me  Results for orders placed or performed during the hospital encounter of 03/20/15 (from the past 48 hour(s))  CBC with Differential/Platelet     Status: Abnormal   Collection Time: 03/20/15 12:21 PM  Result Value Ref Range   WBC 8.1 3.6 - 11.0 K/uL   RBC 5.24 (H) 3.80 - 5.20 MIL/uL   Hemoglobin 13.4 12.0 - 16.0 g/dL   HCT 42.0 35.0 - 47.0 %   MCV 80.3 80.0 - 100.0 fL   MCH 25.6 (L) 26.0 - 34.0 pg   MCHC 31.9 (L) 32.0 - 36.0 g/dL   RDW 16.3 (H) 11.5 - 14.5 %   Platelets 320 150 - 440 K/uL   Neutrophils Relative % 71 %   Neutro Abs 5.8 1.4 - 6.5 K/uL   Lymphocytes Relative 23 %   Lymphs Abs 1.8 1.0 - 3.6 K/uL   Monocytes Relative 4 %   Monocytes Absolute 0.3 0.2 - 0.9 K/uL   Eosinophils Relative 1 %   Eosinophils Absolute 0.1 0 - 0.7 K/uL   Basophils Relative 1 %   Basophils Absolute 0.0 0 - 0.1 K/uL  Comprehensive metabolic panel     Status: Abnormal   Collection Time: 03/20/15 12:21 PM  Result Value Ref Range   Sodium 134 (L)  135 - 145 mmol/L   Potassium 3.7 3.5 - 5.1 mmol/L   Chloride 96 (L) 101 - 111 mmol/L   CO2 27 22 - 32 mmol/L   Glucose, Bld 83 65 - 99 mg/dL   BUN 12 6 - 20 mg/dL   Creatinine, Ser 1.00 0.44 - 1.00 mg/dL   Calcium 9.5 8.9 - 10.3 mg/dL   Total Protein 9.0 (H) 6.5 - 8.1 g/dL   Albumin 4.9 3.5 - 5.0 g/dL   AST 24 15 - 41 U/L   ALT 20 14 - 54 U/L   Alkaline Phosphatase 66 38 - 126 U/L   Total Bilirubin 0.8 0.3 - 1.2 mg/dL   GFR calc non Af Amer >60 >60 mL/min   GFR calc Af Amer >60 >60 mL/min    Comment: (NOTE) The eGFR has been calculated using the CKD EPI equation. This calculation has not been validated in all clinical situations. eGFR's persistently <60 mL/min signify possible Chronic Kidney Disease.    Anion gap 11 5 - 15  Troponin I     Status: None  Collection Time: 03/20/15 12:21 PM  Result Value Ref Range   Troponin I <0.03 <0.031 ng/mL    Comment:        NO INDICATION OF MYOCARDIAL INJURY.   Protime-INR     Status: None   Collection Time: 03/20/15 12:21 PM  Result Value Ref Range   Prothrombin Time 12.9 11.4 - 15.0 seconds   INR 0.95   Hemoglobin A1c     Status: None   Collection Time: 03/20/15  6:13 PM  Result Value Ref Range   Hgb A1c MFr Bld 5.6 4.0 - 6.0 %  Lipid panel     Status: Abnormal   Collection Time: 03/20/15  6:13 PM  Result Value Ref Range   Cholesterol 264 (H) 0 - 200 mg/dL   Triglycerides 65 <150 mg/dL   HDL 58 >40 mg/dL   Total CHOL/HDL Ratio 4.6 RATIO   VLDL 13 0 - 40 mg/dL   LDL Cholesterol 193 (H) 0 - 99 mg/dL    Comment:        Total Cholesterol/HDL:CHD Risk Coronary Heart Disease Risk Table                     Men   Women  1/2 Average Risk   3.4   3.3  Average Risk       5.0   4.4  2 X Average Risk   9.6   7.1  3 X Average Risk  23.4   11.0        Use the calculated Patient Ratio above and the CHD Risk Table to determine the patient's CHD Risk.        ATP III CLASSIFICATION (LDL):  <100     mg/dL   Optimal  100-129  mg/dL    Near or Above                    Optimal  130-159  mg/dL   Borderline  160-189  mg/dL   High  >190     mg/dL   Very High   Troponin I (q 6hr x 3)     Status: None   Collection Time: 03/20/15  6:13 PM  Result Value Ref Range   Troponin I <0.03 <0.031 ng/mL    Comment:        NO INDICATION OF MYOCARDIAL INJURY.   Troponin I (q 6hr x 3)     Status: None   Collection Time: 03/20/15 10:28 PM  Result Value Ref Range   Troponin I <0.03 <0.031 ng/mL    Comment:        NO INDICATION OF MYOCARDIAL INJURY.   Troponin I (q 6hr x 3)     Status: None   Collection Time: 03/21/15  4:28 AM  Result Value Ref Range   Troponin I <0.03 <0.031 ng/mL    Comment:        NO INDICATION OF MYOCARDIAL INJURY.   Basic metabolic panel     Status: Abnormal   Collection Time: 03/21/15  4:28 AM  Result Value Ref Range   Sodium 138 135 - 145 mmol/L   Potassium 4.5 3.5 - 5.1 mmol/L   Chloride 104 101 - 111 mmol/L   CO2 28 22 - 32 mmol/L   Glucose, Bld 105 (H) 65 - 99 mg/dL   BUN 12 6 - 20 mg/dL   Creatinine, Ser 1.00 0.44 - 1.00 mg/dL   Calcium 8.8 (L) 8.9 - 10.3 mg/dL   GFR  calc non Af Amer >60 >60 mL/min   GFR calc Af Amer >60 >60 mL/min    Comment: (NOTE) The eGFR has been calculated using the CKD EPI equation. This calculation has not been validated in all clinical situations. eGFR's persistently <60 mL/min signify possible Chronic Kidney Disease.    Anion gap 6 5 - 15  CBC     Status: Abnormal   Collection Time: 03/21/15  4:28 AM  Result Value Ref Range   WBC 7.3 3.6 - 11.0 K/uL   RBC 4.90 3.80 - 5.20 MIL/uL   Hemoglobin 12.4 12.0 - 16.0 g/dL   HCT 38.9 35.0 - 47.0 %   MCV 79.4 (L) 80.0 - 100.0 fL   MCH 25.2 (L) 26.0 - 34.0 pg   MCHC 31.7 (L) 32.0 - 36.0 g/dL   RDW 16.2 (H) 11.5 - 14.5 %   Platelets 303 150 - 440 K/uL    Ct Head Wo Contrast  03/20/2015   CLINICAL DATA:  Left-sided numbness and blurred vision beginning at 2:30 a.m. today. Initial encounter.  EXAM: CT HEAD WITHOUT  CONTRAST  TECHNIQUE: Contiguous axial images were obtained from the base of the skull through the vertex without intravenous contrast.  COMPARISON:  Head CT scan 08/13/2011.  FINDINGS: The brain appears normal without hemorrhage, infarct, mass lesion, mass effect, midline shift or abnormal extra-axial fluid collection. No hydrocephalus or pneumocephalus. The calvarium is intact. Imaged paranasal sinuses and mastoid air cells are clear.  IMPRESSION: Normal head CT.   Electronically Signed   By: Inge Rise M.D.   On: 03/20/2015 11:46   Mr Jodene Nam Head Wo Contrast  03/20/2015   CLINICAL DATA:  Stroke.  Headache.  Left arm and leg tingling.  EXAM: MRA HEAD WITHOUT CONTRAST  TECHNIQUE: Angiographic images of the Circle of Willis were obtained using MRA technique without intravenous contrast.  COMPARISON:  CT head 03/20/2015  FINDINGS: MRI head was not performed at this time as the patient refused further scanning after the MRA was obtained.  Distal right vertebral artery patent to the basilar. Right PICA patent. Distal left vertebral artery occluded at approximately level of PICA however PICA not visualized. Basilar is small due to fetal origin of the posterior cerebral arteries bilaterally. Basilar is patent. Superior cerebellar arteries are patent bilaterally. Posterior cerebral arteries are patent bilaterally with fetal origin bilaterally.  Signal loss in the anterior genu of the right cavernous carotid compatible with moderate stenosis. Mild stenosis left cavernous carotid. 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 Left vertebral artery is small and non dominant.  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   Electronically Signed   By: Franchot Gallo M.D.   On: 03/20/2015 18:15   Mr Brain Wo Contrast  03/21/2015   CLINICAL DATA:  49 year old female with  acute onset left side tingling and numbness associated with headache and blurred vision. Initial encounter.  EXAM: MRI HEAD WITHOUT CONTRAST  TECHNIQUE: Multiplanar, multiecho pulse sequences of the brain and surrounding structures were obtained without intravenous contrast.  COMPARISON:  Head CT without contrast 03/20/2015. Intracranial MRA 03/20/2015.  FINDINGS: Cerebral volume is normal. No restricted diffusion to suggest acute infarction. No midline shift, mass effect, evidence of mass lesion, ventriculomegaly, extra-axial collection or acute intracranial hemorrhage. Cervicomedullary junction and pituitary are within normal limits. Major intracranial vascular flow voids are stable compared to yesterday MRA.  Occasional small foci  of cerebral white matter T2 and FLAIR hyperintensity. These are mostly subcortical an the configuration is nonspecific. The extent is at the upper limits of normal for age. No cortical encephalomalacia or chronic cerebral blood products. Deep gray matter nuclei, brainstem, and cerebellum are within normal limits.  Visible internal auditory structures appear normal. Mastoids are clear. Left maxillary sinus small mucous retention cyst. Other paranasal sinuses are clear. Orbit and scalp soft tissues appear normal. Negative visualized cervical spine.  IMPRESSION: No acute intracranial abnormality. Essentially normal for age noncontrast MRI appearance of the brain.   Electronically Signed   By: Genevie Ann M.D.   On: 03/21/2015 09:37    Review of Systems  Constitutional: Negative.   HENT: Negative for ear discharge, ear pain, hearing loss, sore throat and tinnitus.   Eyes: Negative.   Respiratory: Negative.   Cardiovascular: Negative.   Gastrointestinal: Negative.   Genitourinary: Negative.   Musculoskeletal: Negative.   Skin: Negative.   Neurological: Positive for dizziness, tingling, sensory change and headaches. Negative for tremors, speech change, focal weakness, seizures and  loss of consciousness.  Psychiatric/Behavioral: Positive for depression.   Blood pressure 121/85, pulse 56, temperature 98.1 F (36.7 C), temperature source Oral, resp. rate 18, height '5\' 6"'  (1.676 m), weight 78.019 kg (172 lb), SpO2 100 %. Physical Exam  Nursing note and vitals reviewed. Constitutional: She appears well-developed and well-nourished. No distress.  HENT:  Head: Normocephalic and atraumatic.  Nose: Nose normal.  Mouth/Throat: Oropharynx is clear and moist.  Eyes: Conjunctivae and EOM are normal. Pupils are equal, round, and reactive to light. No scleral icterus.  Neck: Normal range of motion. Neck supple.  Cardiovascular: Normal rate, regular rhythm, normal heart sounds and intact distal pulses.   No murmur heard. Respiratory: Effort normal and breath sounds normal. No respiratory distress. She has no wheezes.  GI: Soft. Bowel sounds are normal. She exhibits no distension.  Musculoskeletal: Normal range of motion. She exhibits no edema or tenderness.  Neurological:  A+Ox3, nl speech and language PERRLA, EOMI, nl VF, face symmetric, tongue midline 5/5 B, nl tone FTN and HTS WNL 1+/4 B, downgoing plantars Intact to temp and light touch  NIHSS 0  Skin: She is not diaphoretic.   MRI of brain personally reviewed by me and normal  MRA of head shows L vertebral occlusion  Assessment/Plan: 1.  L sided weakness-  No clear etiology as there is no obvious stroke on MRI;  This could be epileptic, migrainous, cervical disease or even B12 defiency 2.  L vertebral occlusion-  Asymptomatic and unoperable,  This could be dissection from recent chiropractor manipulation -  EEG -  MRI of cervical spine w/o contrast -  Check B12/folate, TSH -  Start ASA 72m daily -  Start lipitor [id number] -  Therapy consults -  Will follow  Adrienne Todd 03/21/2015, 10:05 AM

## 2015-03-21 NOTE — Plan of Care (Signed)
Problem: Discharge/Transitional Outcomes Goal: Other Discharge Outcomes/Goals Outcome: Progressing Plan of care progress to goal: Pt understands signs and symptoms of stroke and has handout to review. VSS. Pt up ad lib. Tolerating food and drink. Pt has slight tingling in left hand, but strength is good, no drift.

## 2015-03-21 NOTE — Evaluation (Signed)
Physical Therapy Evaluation Patient Details Name: Adrienne Todd MRN: [id number] DOB: 02-22-1966 Today's Date: 03/21/2015   History of Present Illness  Adrienne Todd is a 49 y.o. female with no significant past medical history presents to the hospital secondary to L sided tingling and numbness. Patient said she never had this symptom . She woke up around 2:30 AM in the morning and felt that her left arm was numb and tingling and felt heavy. She is right-handed at baseline. She was seeing double vision. She thought she was tired she went back to sleep and woke up again an hour later continued to have the same symptoms. She had some nausea at the same time denied any chest pain. She went to her clinic and complained of these symptoms and was advised to come to the emergency room. CT and MRI brain negative for CVA. MRA showed L vertebral artery occlusion. At the time of evaluation pt reports full resolution of symptoms with the exception of some tingling in her L hand. Pt reports independent ambulation in room to bathroom since admission. Pt is lethargic and somnolent at this time due to sedation required to complete brain MRI. Pt also currently reports nausea. Prior to admission pt reports full independence with ADL/IADLs and community ambulation without assistive device.  Clinical Impression  No deficits identified during PT assessment with the exception of subjective mild L hand tingling. Otherwise exam is [facility] with stable transfers and ambulation. No focal weakness identified. Pt is somnolent and nauseated during session which is somewhat limiting but reports fully independent ambulation in room throughout admission. No PT needs identified. Order will be completed. Please enter new order if status changes or new needs arise.      Follow Up Recommendations No PT follow up    Equipment Recommendations  None recommended by PT    Recommendations for Other Services       Precautions / Restrictions  Precautions Precautions: None Restrictions Weight Bearing Restrictions: No      Mobility  Bed Mobility Overal bed mobility: Independent                Transfers Overall transfer level: Independent Equipment used: None             General transfer comment: Reasonable speed and sequencing. Pt is mildly unsteady due to being very somnolent. Pt was sedated for MRI. Pt confirms safe and independent ambulation in room throughout admission  Ambulation/Gait Ambulation/Gait assistance: Min guard Ambulation Distance (Feet): 60 Feet Assistive device: None Gait Pattern/deviations: WFL(Within Functional Limits)     General Gait Details: Pt demonstrates some staggering with gait due to somnolence without assistive device but no overt LOB. Reasonably stable with good safety awareness. Gait speed is functional. Pt reports independent ambulation in room since admission without issue  Stairs            Wheelchair Mobility    Modified Rankin (Stroke Patients Only)       Balance Overall balance assessment: Independent                                           Pertinent Vitals/Pain Pain Assessment: No/denies pain (Denies headache)    Home Living Family/patient expects to be discharged to:: Private residence Living Arrangements: Spouse/significant other Available Help at Discharge: Family Type of Home: Mobile home Home Access: Stairs to enter Entrance Stairs-Rails: Can reach  both Entrance Stairs-Number of Steps: 5 Home Layout: One level Home Equipment: None      Prior Function Level of Independence: Independent               Hand Dominance   Dominant Hand: Right    Extremity/Trunk Assessment   Upper Extremity Assessment: Overall WFL for tasks assessed (No focal deficits)           Lower Extremity Assessment: Overall WFL for tasks assessed (No focal deficits)         Communication   Communication: No difficulties   Cognition Arousal/Alertness: Lethargic Behavior During Therapy: WFL for tasks assessed/performed Overall Cognitive Status: Within Functional Limits for tasks assessed                      General Comments      Exercises        Assessment/Plan    PT Assessment Patent does not need any further PT services  PT Diagnosis     PT Problem List    PT Treatment Interventions     PT Goals (Current goals can be found in the Care Plan section)      Frequency     Barriers to discharge        Co-evaluation               End of Session Equipment Utilized During Treatment: Gait belt Activity Tolerance: Treatment limited secondary to medical complications (Comment);Patient limited by lethargy;Other (comment) (Limited by nausea) Patient left: in bed;with family/visitor present (MD in with patient)      Functional Assessment Tool Used: clinical judgement Functional Limitation: Mobility: Walking and moving around Mobility: Walking and Moving Around Current Status [phone]): 0 percent impaired, limited or restricted Mobility: Walking and Moving Around Goal Status [phone]): 0 percent impaired, limited or restricted Mobility: Walking and Moving Around Discharge Status [phone]): 0 percent impaired, limited or restricted    Time: 1330-1347 PT Time Calculation (min) (ACUTE ONLY): 17 min   Charges:   PT Evaluation $Initial PT Evaluation Tier I: 1 Procedure     PT G Codes:   PT G-Codes **NOT FOR INPATIENT CLASS** Functional Assessment Tool Used: clinical judgement Functional Limitation: Mobility: Walking and moving around Mobility: Walking and Moving Around Current Status (G3875): 0 percent impaired, limited or restricted Mobility: Walking and Moving Around Goal Status (I4332): 0 percent impaired, limited or restricted Mobility: Walking and Moving Around Discharge Status (R5188): 0 percent impaired, limited or restricted   Lynnea Maizes PT, DPT    Kierre Hintz 03/21/2015, 2:16 PM

## 2015-04-05 ENCOUNTER — Other Ambulatory Visit: Payer: Self-pay | Admitting: Vascular Surgery

## 2015-04-05 DIAGNOSIS — I6523 Occlusion and stenosis of bilateral carotid arteries: Secondary | ICD-10-CM

## 2015-04-12 ENCOUNTER — Ambulatory Visit
Admission: RE | Admit: 2015-04-12 | Discharge: 2015-04-12 | Disposition: A | Discharge status: Home / Self Care | Payer: 59 | Source: Ambulatory Visit | Attending: Vascular Surgery | Admitting: Vascular Surgery

## 2015-04-12 DIAGNOSIS — G450 Vertebro-basilar artery syndrome: Secondary | ICD-10-CM | POA: Insufficient documentation

## 2015-04-12 DIAGNOSIS — I6523 Occlusion and stenosis of bilateral carotid arteries: Secondary | ICD-10-CM | POA: Diagnosis not present

## 2015-04-12 MED ORDER — IOHEXOL 350 MG/ML SOLN
100.0000 mL | Freq: Once | INTRAVENOUS | Status: AC | PRN
  Administered 2015-04-12: 80 mL via INTRAVENOUS

## 2016-11-02 IMAGING — MR MR CERVICAL SPINE WO/W CM
8 series · 47 of 48 positions shown · IV contrast (multihance)
Comparison: 11/20/2014

CLINICAL DATA: Left arm tingling and numbness.  Neck pain.

EXAM:
MRI CERVICAL SPINE WITHOUT AND WITH CONTRAST
TECHNIQUE: Multiplanar and multiecho pulse sequences of the cervical spine, to
include the craniocervical junction and cervicothoracic junction,
were obtained according to standard protocol without and with
intravenous contrast.
CONTRAST:  16mL MULTIHANCE GADOBENATE DIMEGLUMINE 529 MG/ML IV SOLN

[Series 2: T2 · sagittal · 3.0mm · 0.70mm/px · 4 of 15 slices shown (1 of 2)]
[im 1/15]
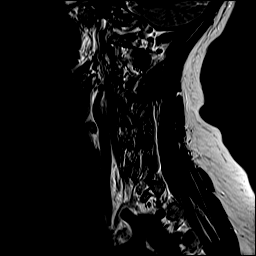
[im 5/15]
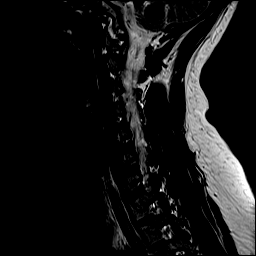
[im 10/15]
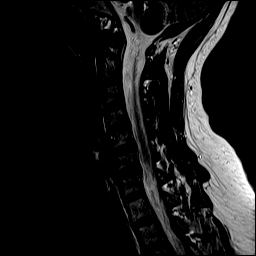
[im 15/15]
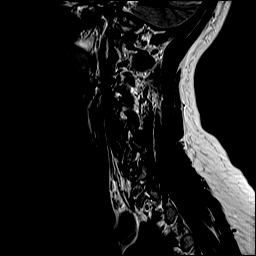

[Series 3: T1 · sagittal · 3.0mm · 0.70mm/px · 4 of 15 slices shown (1 of 2)]
[im 1/15]
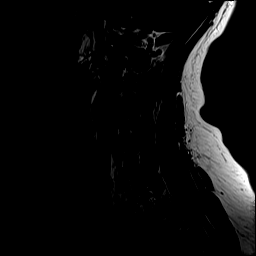
[im 5/15]
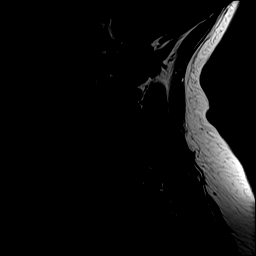
[im 10/15]
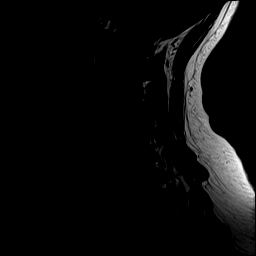
[im 15/15]
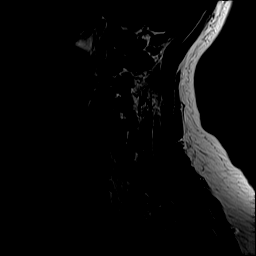

[Series 4: STIR · sagittal · 3.0mm · 0.70mm/px · 4 of 15 slices shown]
[im 1/15]
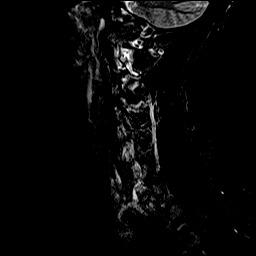
[im 5/15]
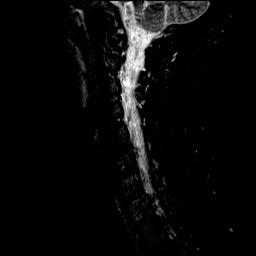
[im 10/15]
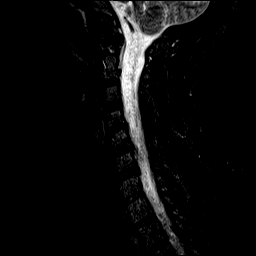
[im 15/15]
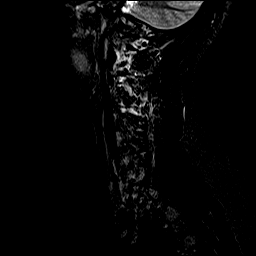

[Series 5: T2 · axial · 3.0mm · 0.70mm/px · z∈[-73,+30]mm · 8 of 28 slices shown (2 of 2)]
[im 1/28]
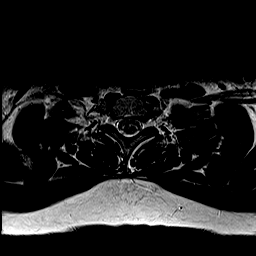
[im 4/28]
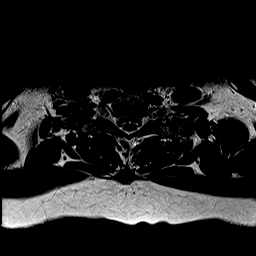
[im 8/28]
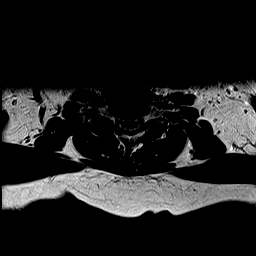
[im 12/28]
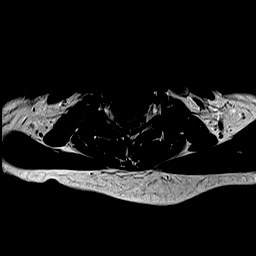
[im 16/28]
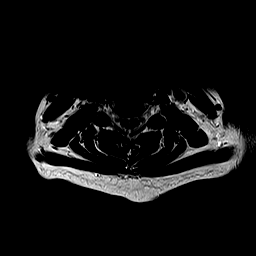
[im 20/28]
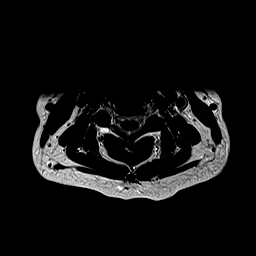
[im 24/28]
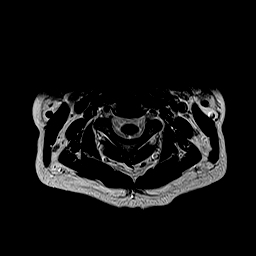
[im 28/28]
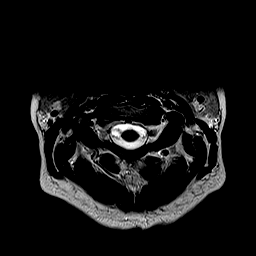

[Series 6: mpgr ax · axial · 3.0mm · 0.35mm/px · z∈[-73,+14]mm · 7 of 28 slices shown]
[im 1/28]
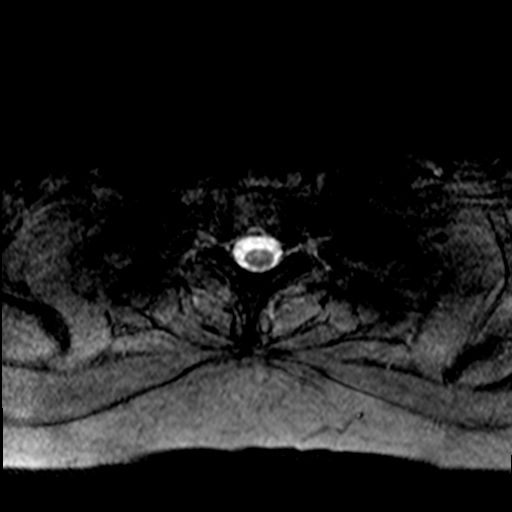
[im 4/28]
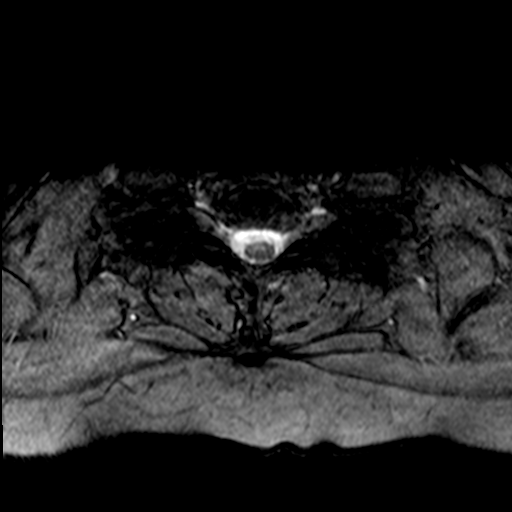
[im 8/28]
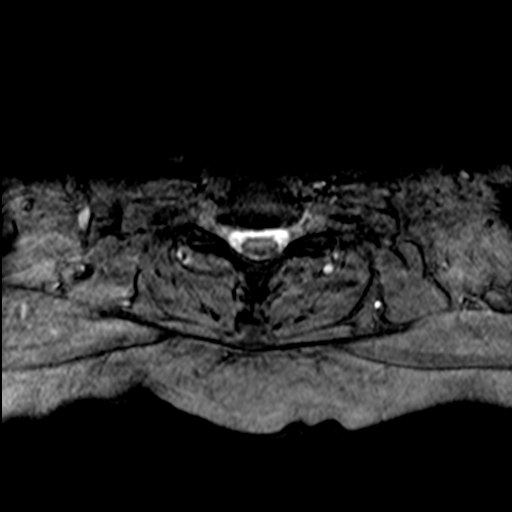
[im 12/28]
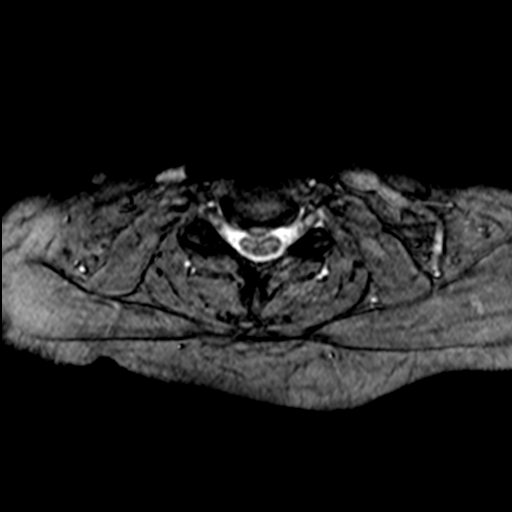
[im 16/28]
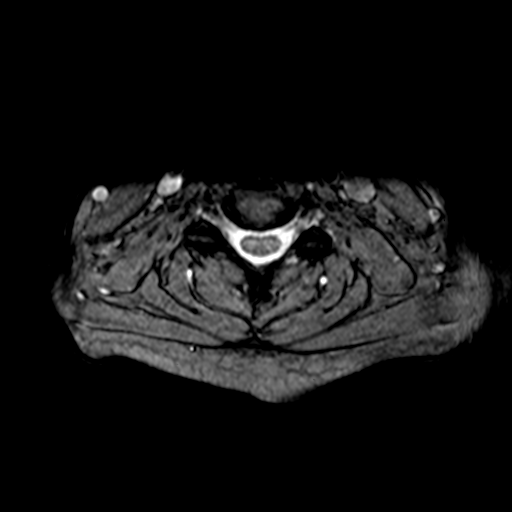
[im 20/28]
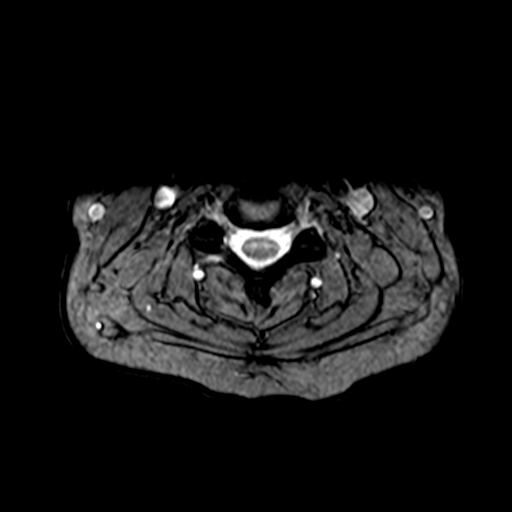
[im 24/28]
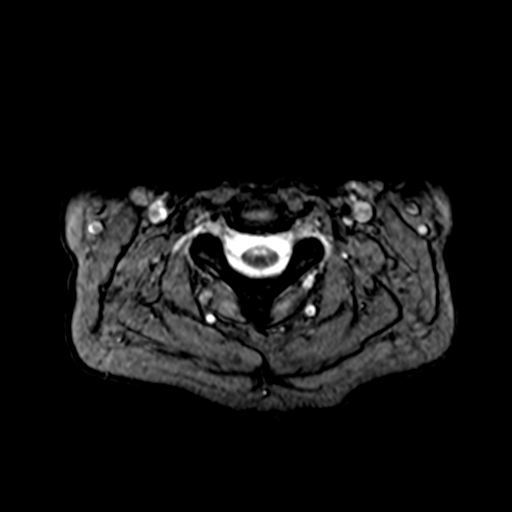

[Series 7: T1 · axial · 3.0mm · 0.70mm/px · z∈[-73,+30]mm · 8 of 28 slices shown (2 of 2)]
[im 1/28]
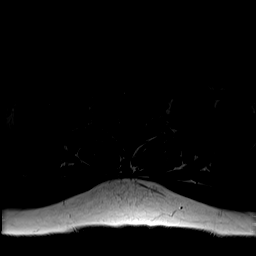
[im 4/28]
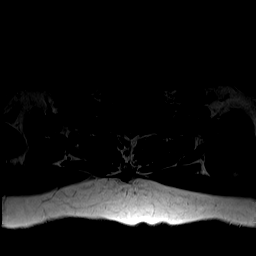
[im 8/28]
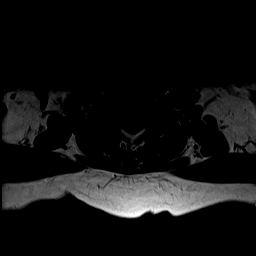
[im 12/28]
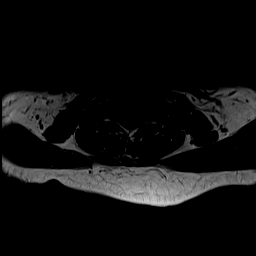
[im 16/28]
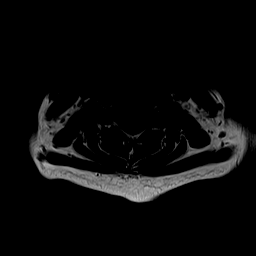
[im 20/28]
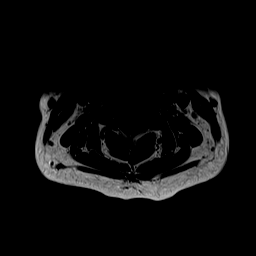
[im 24/28]
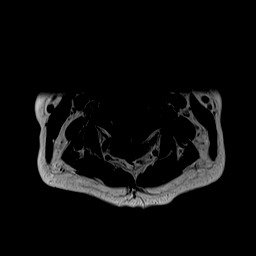
[im 28/28]
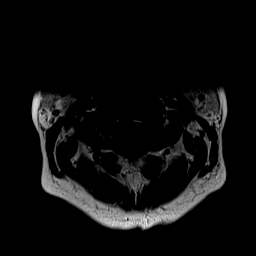

[Series 8: T1 fat-sat post-contrast · sagittal · 3.0mm · 0.70mm/px · 4 of 15 slices shown]
[im 1/15]
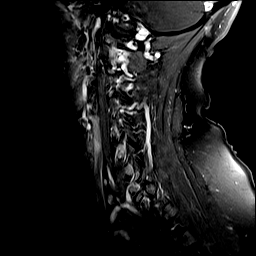
[im 5/15]
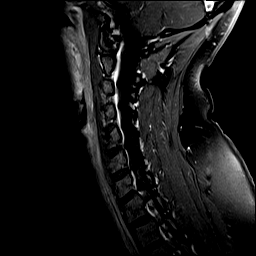
[im 10/15]
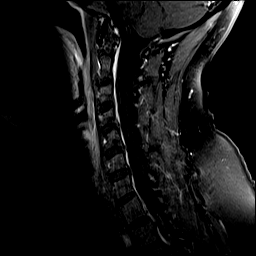
[im 15/15]
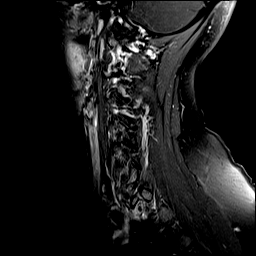

[Series 9: T1 post-contrast · axial · 3.0mm · 0.70mm/px · z∈[-73,+30]mm · 8 of 28 slices shown]
[im 1/28]
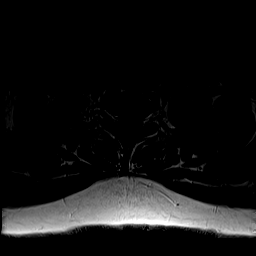
[im 4/28]
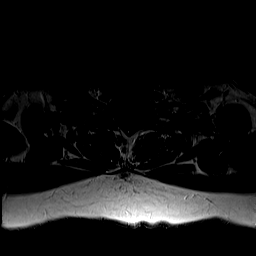
[im 8/28]
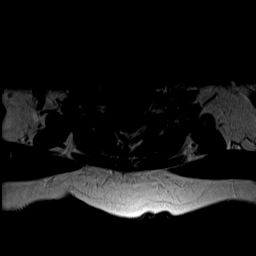
[im 12/28]
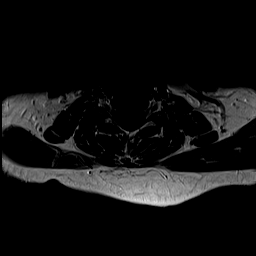
[im 16/28]
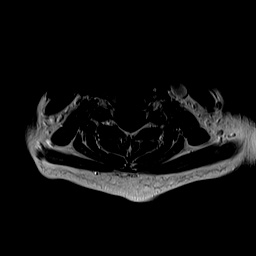
[im 20/28]
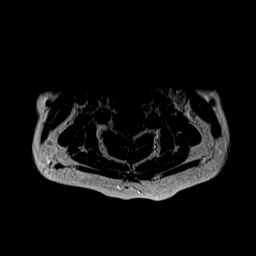
[im 24/28]
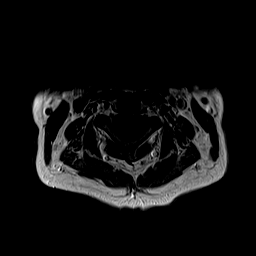
[im 28/28]
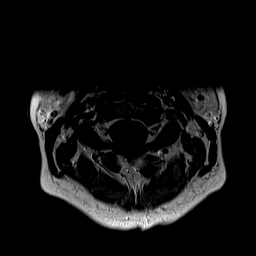

[47 of 48 positions shown; findings below may reference images not displayed]

FINDINGS: The craniocervical junction appears unremarkable. No significant
abnormal spinal cord signal is observed.

Despite efforts by the technologist and patient, motion artifact is
present on today's exam and could not be eliminated. This reduces
exam sensitivity and specificity. No significant vertebral marrow
edema is identified. No abnormal cervical spine or cord enhancement
is identified.

Additional findings at individual levels are as follows:

C2-3:  Unremarkable.

C3-4:  Unremarkable.

C4-5:  No impingement.  Mild disc bulge.

C5-6: Mild to moderate central narrowing of the thecal sac and
borderline right foraminal stenosis due to central disc protrusion
and minimal right eccentric disc bulge.

C6-7:  Borderline right foraminal stenosis due to disc bulge.

C7-T1:  Unremarkable.
IMPRESSION: 1. The dominant finding is a central disc protrusion at C5-6 causing
mild-to-moderate central narrowing of the thecal sac. There is also
borderline right foraminal stenosis at C5-6 and C6-7 due to disc
bulges.

## 2018-07-11 ENCOUNTER — Emergency Department
Admission: EM | Admit: 2018-07-11 | Discharge: 2018-07-11 | Disposition: A | Discharge status: Home / Self Care | Payer: 59 | Attending: Student in an Organized Health Care Education/Training Program | Admitting: Student in an Organized Health Care Education/Training Program

## 2018-07-11 ENCOUNTER — Other Ambulatory Visit: Payer: Self-pay

## 2018-07-11 ENCOUNTER — Encounter: Payer: Self-pay | Admitting: Emergency Medicine

## 2018-07-11 DIAGNOSIS — Z7982 Long term (current) use of aspirin: Secondary | ICD-10-CM | POA: Diagnosis not present

## 2018-07-11 DIAGNOSIS — M25522 Pain in left elbow: Secondary | ICD-10-CM | POA: Diagnosis present

## 2018-07-11 DIAGNOSIS — W57XXXA Bitten or stung by nonvenomous insect and other nonvenomous arthropods, initial encounter: Secondary | ICD-10-CM | POA: Insufficient documentation

## 2018-07-11 DIAGNOSIS — S50861A Insect bite (nonvenomous) of right forearm, initial encounter: Secondary | ICD-10-CM

## 2018-07-11 DIAGNOSIS — L03114 Cellulitis of left upper limb: Secondary | ICD-10-CM | POA: Insufficient documentation

## 2018-07-11 DIAGNOSIS — Z79899 Other long term (current) drug therapy: Secondary | ICD-10-CM | POA: Diagnosis not present

## 2018-07-11 DIAGNOSIS — L03113 Cellulitis of right upper limb: Secondary | ICD-10-CM

## 2018-07-11 MED ORDER — SULFAMETHOXAZOLE-TRIMETHOPRIM 800-160 MG PO TABS
1.0000 | ORAL_TABLET | Freq: Once | ORAL | Status: AC
  Administered 2018-07-11: 1 via ORAL
  Filled 2018-07-11: qty 1

## 2018-07-11 MED ORDER — SULFAMETHOXAZOLE-TRIMETHOPRIM 800-160 MG PO TABS: 1.0000 | ORAL_TABLET | Freq: Two times a day (BID) | ORAL | 0 refills | Status: DC

## 2018-07-11 NOTE — ED Provider Notes (Signed)
[facility] Emergency Department Provider Note ____________________________________________  Time seen: 1116  I have reviewed the triage vital signs and the nursing notes.  HISTORY  Chief Complaint  Insect Bite  HPI Adrienne Todd is a 52 y.o. female resents herself to the ED for evaluation of an insect bite to the left volar elbow.  Patient describes since the insect bite, areas to become increasingly warm, red, and firm.  She denies any fevers, chills, sweats but she is unclear of the exact cause of the insect bite, but presents for increasing pain and tenderness.  She denies any fevers, chills, sweats.  Past Medical History:  Diagnosis Date  . Depression     Patient Active Problem List   Diagnosis Date Noted  . CVA (cerebral infarction) 03/20/2015    Past Surgical History:  Procedure Laterality Date  . CESAREAN SECTION      Prior to Admission medications   Medication Sig Start Date End Date Taking? Authorizing Provider  aspirin EC 81 MG EC tablet Take 1 tablet (81 mg total) by mouth daily. 03/21/15   Delfino Lovett, MD  atorvastatin (LIPITOR) 20 MG tablet Take 1 tablet (20 mg total) by mouth daily at 6 PM. 03/21/15   Delfino Lovett, MD  naproxen (NAPROSYN) 500 MG tablet Take 1 tablet (500 mg total) by mouth 2 (two) times daily with a meal. 03/21/15   Delfino Lovett, MD  pantoprazole (PROTONIX) 40 MG tablet Take 40 mg by mouth daily.    [provider]  sertraline (ZOLOFT) 50 MG tablet Take 50 mg by mouth daily.    [provider]  sulfamethoxazole-trimethoprim (BACTRIM DS,SEPTRA DS) 800-160 MG tablet Take 1 tablet by mouth 2 (two) times daily. 07/11/18   Aizik Reh, Charlesetta Ivory, PA-C    Allergies Penicillins  Family History  Problem Relation Age of Onset  . CAD Mother   . CAD Father   . CVA Father     Social History Social History   Tobacco Use  . Smoking status: Never Smoker  . Smokeless tobacco: Never Used  Substance Use Topics   . Alcohol use: No  . Drug use: Not Currently    Review of Systems  Constitutional: Negative for fever. Cardiovascular: Negative for chest pain. Respiratory: Negative for shortness of breath. Musculoskeletal: Negative for back pain. Skin: Negative for rash.  Insect bite to the right upper extremity as above. Neurological: Negative for headaches, focal weakness or numbness. ____________________________________________  PHYSICAL EXAM:  VITAL SIGNS: ED Triage Vitals  Enc Vitals Group     BP 07/11/18 1019 135/77     Pulse Rate 07/11/18 1019 67     Resp 07/11/18 1019 16     Temp 07/11/18 1019 98.2 F (36.8 C)     Temp Source 07/11/18 1019 Oral     SpO2 07/11/18 1019 100 %     Weight 07/11/18 1021 166 lb (75.3 kg)     Height 07/11/18 1021 5\' 6"  (1.676 m)     Head Circumference --      Peak Flow --      Pain Score 07/11/18 1020 2     Pain Loc --      Pain Edu? --      Excl. in GC? --     Constitutional: Alert and oriented. Well appearing and in no distress. Head: Normocephalic and atraumatic. Eyes: Conjunctivae are normal. Normal extraocular movements Cardiovascular: Normal rate, regular rhythm. Normal distal pulses. Respiratory: Normal respiratory effort. No wheezes/rales/rhonchi. Musculoskeletal:  Nontender with normal range of motion in all extremities.  Neurologic:  Normal gait without ataxia. Normal speech and language. No gross focal neurologic deficits are appreciated. Skin:  Skin is warm, dry and intact. No rash noted.  Right upper extremity with a dorsal elbow cellulitis.  Patient with a central pustule with surrounding erythema, induration, and warmth. ____________________________________________  PROCEDURES  Procedures Bactrim DS 1 p.o. ____________________________________________  INITIAL IMPRESSION / ASSESSMENT AND PLAN / ED COURSE  Patient with ED evaluation of a possibly infected insect bite.  Patient presentation is consistent with cellulitis.  She will  be treated empirically for MRSA with a prescription for Bactrim.  She is encouraged to keep the area clean, dry, and covered.  She should return to the ED for acutely worsening symptoms. ____________________________________________  FINAL CLINICAL IMPRESSION(S) / ED DIAGNOSES  Final diagnoses:  Insect bite of right forearm, initial encounter  Cellulitis of right upper extremity      Karmen StabsMenshew, Charlesetta IvoryJenise V Bacon, PA-C 07/11/18 1918    Willy Eddyobinson, Patrick, MD 07/17/18 2307

## 2018-07-11 NOTE — ED Triage Notes (Signed)
Pt to ED via POV c/o insect bite on her right arm since Friday. Pt states that the swelling has progressively gotten worse. Pt states that area is painful. Pt is in NAD at this time.

## 2018-07-11 NOTE — Discharge Instructions (Signed)
You are being treated for a cellulitis following an insect bite. Keep the wound clean and covered. Apply warm compresses to promote healing. Take the antibiotic as directed. Return if needed.

## 2018-08-23 ENCOUNTER — Ambulatory Visit: Payer: Self-pay | Admitting: Obstetrics and Gynecology

## 2018-08-25 ENCOUNTER — Ambulatory Visit: Payer: Self-pay | Admitting: Obstetrics and Gynecology

## 2018-08-27 ENCOUNTER — Other Ambulatory Visit (HOSPITAL_COMMUNITY)
Admission: RE | Admit: 2018-08-27 | Discharge: 2018-08-27 | Disposition: A | Discharge status: Home / Self Care | Payer: 59 | Source: Ambulatory Visit | Attending: Obstetrics and Gynecology | Admitting: Obstetrics and Gynecology

## 2018-08-27 ENCOUNTER — Encounter: Payer: Self-pay | Admitting: Obstetrics and Gynecology

## 2018-08-27 ENCOUNTER — Ambulatory Visit (INDEPENDENT_AMBULATORY_CARE_PROVIDER_SITE_OTHER): Payer: 59 | Admitting: Obstetrics and Gynecology

## 2018-08-27 VITALS — BP 132/82 | HR 69 | Ht 66.0 in | Wt 180.0 lb

## 2018-08-27 DIAGNOSIS — Z23 Encounter for immunization: Secondary | ICD-10-CM | POA: Diagnosis not present

## 2018-08-27 DIAGNOSIS — Z1329 Encounter for screening for other suspected endocrine disorder: Secondary | ICD-10-CM

## 2018-08-27 DIAGNOSIS — Z13220 Encounter for screening for lipoid disorders: Secondary | ICD-10-CM

## 2018-08-27 DIAGNOSIS — Z131 Encounter for screening for diabetes mellitus: Secondary | ICD-10-CM

## 2018-08-27 DIAGNOSIS — R55 Syncope and collapse: Secondary | ICD-10-CM

## 2018-08-27 DIAGNOSIS — Z124 Encounter for screening for malignant neoplasm of cervix: Secondary | ICD-10-CM | POA: Insufficient documentation

## 2018-08-27 DIAGNOSIS — Z01419 Encounter for gynecological examination (general) (routine) without abnormal findings: Secondary | ICD-10-CM | POA: Diagnosis not present

## 2018-08-27 DIAGNOSIS — Z1239 Encounter for other screening for malignant neoplasm of breast: Secondary | ICD-10-CM

## 2018-08-27 DIAGNOSIS — Z1321 Encounter for screening for nutritional disorder: Secondary | ICD-10-CM

## 2018-08-27 MED ORDER — GABAPENTIN 100 MG PO CAPS: ORAL_CAPSULE | ORAL | 3 refills | Status: DC

## 2018-08-27 NOTE — Patient Instructions (Signed)
[facility] [address] [place] [address]  [facility] [place]  [address] [place] [address]  Phone: [phone]  

## 2018-08-27 NOTE — Progress Notes (Signed)
Gynecology Annual Exam  PCP: Raynelle Bring  Chief Complaint:  Chief Complaint  Patient presents with  . Gynecologic Exam    History of Present Illness:Patient is a 53 y.o. G3P3 presents for annual exam. The patient has no complaints today.   LMP: No LMP recorded. (Menstrual status: IUD).  No issues with breakthrough bleeding on Mirena placed 08/21/2014.  Does endorse some vasomotro symptoms.    There is no notable family history of breast or ovarian cancer in her family.  The patient wears seatbelts: yes.   The patient has regular exercise: not asked.    The patient denies current symptoms of depression.     Review of Systems: Review of Systems  Constitutional: Negative for chills and fever.  HENT: Negative for congestion.   Respiratory: Negative for cough and shortness of breath.   Cardiovascular: Negative for chest pain and palpitations.  Gastrointestinal: Negative for abdominal pain, constipation, diarrhea, heartburn, nausea and vomiting.  Genitourinary: Negative for dysuria, frequency and urgency.  Skin: Negative for itching and rash.  Neurological: Negative for dizziness and headaches.  Endo/Heme/Allergies: Negative for polydipsia.  Psychiatric/Behavioral: Negative for depression.    Past Medical History:  Past Medical History:  Diagnosis Date  . Depression     Past Surgical History:  Past Surgical History:  Procedure Laterality Date  . CESAREAN SECTION      Gynecologic History:  No LMP recorded. (Menstrual status: IUD). Last Pap: Results were: 06/28/2015 NIL and HR HPV negative  Last mammogram: 06/28/2015 Results were: BI-RAD I   Obstetric History: G3P3  Family History:  Family History  Problem Relation Age of Onset  . CAD Mother   . CAD Father   . CVA Father     Social History:  Social History   Socioeconomic History  . Marital status: Married    Spouse name: Not on file  . Number of children: Not on file  . Years of education:  Not on file  . Highest education level: Not on file  Occupational History  . Not on file  Social Needs  . Financial resource strain: Not on file  . Food insecurity:    Worry: Not on file    Inability: Not on file  . Transportation needs:    Medical: Not on file    Non-medical: Not on file  Tobacco Use  . Smoking status: Never Smoker  . Smokeless tobacco: Never Used  Substance and Sexual Activity  . Alcohol use: No  . Drug use: Not Currently  . Sexual activity: Not Currently    Birth control/protection: I.U.D.  Lifestyle  . Physical activity:    Days per week: Not on file    Minutes per session: Not on file  . Stress: Not on file  Relationships  . Social connections:    Talks on phone: Not on file    Gets together: Not on file    Attends religious service: Not on file    Active member of club or organization: Not on file    Attends meetings of clubs or organizations: Not on file    Relationship status: Not on file  . Intimate partner violence:    Fear of current or ex partner: Not on file    Emotionally abused: Not on file    Physically abused: Not on file    Forced sexual activity: Not on file  Other Topics Concern  . Not on file  Social History Narrative  . Not on file  Allergies:  Allergies  Allergen Reactions  . Penicillins Rash    Medications: Prior to Admission medications   Not on File    Physical Exam Vitals: Blood pressure 132/82, pulse 69, height 5\' 6"  (1.676 m), weight 180 lb (81.6 kg).  General: NAD HEENT: normocephalic, anicteric Thyroid: no enlargement, no palpable nodules Pulmonary: No increased work of breathing, CTAB Cardiovascular: RRR, distal pulses 2+ Breast: Breast symmetrical, no tenderness, no palpable nodules or masses, no skin or nipple retraction present, no nipple discharge.  No axillary or supraclavicular lymphadenopathy. Abdomen: NABS, soft, non-tender, non-distended.  Umbilicus without lesions.  No hepatomegaly,  splenomegaly or masses palpable. No evidence of hernia  Genitourinary:  External: Normal external female genitalia.  Normal urethral meatus, normal Bartholin's and Skene's glands.    Vagina: Normal vaginal mucosa, no evidence of prolapse.    Cervix: Grossly normal in appearance, no bleeding  Uterus: Non-enlarged, mobile, normal contour.  No CMT  Adnexa: ovaries non-enlarged, no adnexal masses  Rectal: deferred  Lymphatic: no evidence of inguinal lymphadenopathy Extremities: no edema, erythema, or tenderness Neurologic: Grossly intact Psychiatric: mood appropriate, affect full  Female chaperone present for pelvic and breast  portions of the physical exam     Assessment: 53 y.o. G3P3 routine annual exam  Plan: Problem List Items Addressed This Visit    None    Visit Diagnoses    Encounter for gynecological examination without abnormal finding    -  Primary   Relevant Orders   CMP14+LP+TP+TSH+CBC/Plt   Vitamin D (25 hydroxy)   Screening for malignant neoplasm of cervix       Relevant Orders   Cytology - PAP   Breast screening       Relevant Orders   MM 3D SCREEN BREAST BILATERAL   Screening for diabetes mellitus       Relevant Orders   CMP14+LP+TP+TSH+CBC/Plt   Thyroid disorder screening       Relevant Orders   CMP14+LP+TP+TSH+CBC/Plt   Lipid screening       Relevant Orders   CMP14+LP+TP+TSH+CBC/Plt   Encounter for vitamin deficiency screening       Relevant Orders   Vitamin D (25 hydroxy)   Vasomotor instability       Relevant Orders   Follicle stimulating hormone   Estradiol      1) Mammogram - recommend yearly screening mammogram.  Mammogram Was ordered today  2) STI screening  was notoffered and therefore not obtained  3) ASCCP guidelines and rational discussed.  Patient opts for every 3 years screening interval  4) Osteoporosis  - per USPTF routine screening DEXA at age 20  5) Routine healthcare maintenance including cholesterol, diabetes screening  discussed Ordered today  6) Colonoscopy UTD  7) Mirena - FSH and estradiol to see if menopausal and ok to discontinue  8) Vasomotor symptoms - previously tried [place], given CVA history no estrogen.  Will trial on neurontin.  9) Return in about 1 year (around 08/28/2019) for annual.    Vena Austria, MD Domingo Pulse, [facility] 08/27/2018, 10:13 AM

## 2018-08-28 LAB — CMP14+LP+TP+TSH+CBC/PLT
ALBUMIN: 4.4 g/dL (ref 3.8–4.9)
ALT: 19 IU/L (ref 0–32)
AST: 21 IU/L (ref 0–40)
Albumin/Globulin Ratio: 1.5 (ref 1.2–2.2)
Alkaline Phosphatase: 81 IU/L (ref 39–117)
BUN / CREAT RATIO: 16 (ref 9–23)
BUN: 16 mg/dL (ref 6–24)
Bilirubin Total: 0.4 mg/dL (ref 0.0–1.2)
CALCIUM: 9.6 mg/dL (ref 8.7–10.2)
CHOLESTEROL TOTAL: 254 mg/dL — AB (ref 100–199)
CO2: 26 mmol/L (ref 20–29)
Chloride: 97 mmol/L (ref 96–106)
Creatinine, Ser: 1 mg/dL (ref 0.57–1.00)
Free Thyroxine Index: 1.3 (ref 1.2–4.9)
GFR calc Af Amer: 75 mL/min/{1.73_m2} (ref >59)
GFR calc non Af Amer: 65 mL/min/{1.73_m2} (ref >59)
GLOBULIN, TOTAL: 2.9 g/dL (ref 1.5–4.5)
Glucose: 85 mg/dL (ref 65–99)
HDL: 59 mg/dL (ref >39)
HEMOGLOBIN: 13.3 g/dL (ref 11.1–15.9)
Hematocrit: 38.9 % (ref 34.0–46.6)
LDL CALC: 166 mg/dL — AB (ref 0–99)
LDl/HDL Ratio: 2.8 ratio (ref 0.0–3.2)
MCH: 27.7 pg (ref 26.6–33.0)
MCHC: 34.2 g/dL (ref 31.5–35.7)
MCV: 81 fL (ref 79–97)
PLATELETS: 334 10*3/uL (ref 150–450)
Potassium: 5.4 mmol/L — ABNORMAL HIGH (ref 3.5–5.2)
RBC: 4.81 x10E6/uL (ref 3.77–5.28)
RDW: 13.2 % (ref 11.7–15.4)
SODIUM: 139 mmol/L (ref 134–144)
T3 UPTAKE RATIO: 22 % — AB (ref 24–39)
T4, Total: 6.1 ug/dL (ref 4.5–12.0)
TOTAL PROTEIN: 7.3 g/dL (ref 6.0–8.5)
TRIGLYCERIDES: 146 mg/dL (ref 0–149)
TSH: 1.62 u[IU]/mL (ref 0.450–4.500)
VLDL CHOLESTEROL CAL: 29 mg/dL (ref 5–40)
WBC: 8.2 10*3/uL (ref 3.4–10.8)

## 2018-08-28 LAB — ESTRADIOL: Estradiol: 12.7 pg/mL

## 2018-08-28 LAB — VITAMIN D 25 HYDROXY (VIT D DEFICIENCY, FRACTURES): VIT D 25 HYDROXY: 21.1 ng/mL — AB (ref 30.0–100.0)

## 2018-08-28 LAB — FOLLICLE STIMULATING HORMONE: FSH: 157 m[IU]/mL

## 2018-08-30 LAB — CYTOLOGY - PAP
Diagnosis: NEGATIVE
HPV (WINDOPATH): NOT DETECTED

## 2018-09-17 ENCOUNTER — Other Ambulatory Visit: Payer: Self-pay | Admitting: Obstetrics and Gynecology

## 2018-09-17 MED ORDER — VITAMIN D (ERGOCALCIFEROL) 1.25 MG (50000 UNIT) PO CAPS: 50000.0000 [IU] | ORAL_CAPSULE | ORAL | 0 refills | Status: AC

## 2018-10-15 ENCOUNTER — Other Ambulatory Visit: Payer: Self-pay | Admitting: Obstetrics and Gynecology

## 2018-10-15 NOTE — Telephone Encounter (Signed)
advise

## 2018-12-31 ENCOUNTER — Other Ambulatory Visit: Payer: Self-pay | Admitting: Obstetrics and Gynecology
